# Patient Record
Sex: Female | Born: 1995 | Race: White | Hispanic: No | Marital: Single
Health system: Midwestern US, Community
[De-identification: ages and names within clinical notes are randomized; demographics above are authoritative.]

## PROBLEM LIST (undated history)

## (undated) MED FILL — ARIPIPRAZOLE 10MG TABS: 10 MG | 30 days supply | Qty: 30 | Fill #0 | Status: AC

---
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
## 2017-11-06 ENCOUNTER — Ambulatory Visit: Admit: 2017-11-06 | Payer: PRIVATE HEALTH INSURANCE | Attending: Psychiatric/Mental Health

## 2017-11-06 DIAGNOSIS — F339 Major depressive disorder, recurrent, unspecified: Secondary | ICD-10-CM

## 2017-11-06 LAB — CBC
Basophils Absolute: 0.1 10*3/uL (ref 0.0–0.3)
Basophils Relative: 0.7 % (ref 0.0–2.0)
Eosinophils Absolute: 0.3 10*3/uL (ref 0.0–0.6)
Eosinophils Relative: 3.3 % (ref 0.0–7.0)
Hematocrit: 42.4 % (ref 35.0–46.0)
Hemoglobin: 14.6 G/DL (ref 12.0–15.6)
Lymphocytes Absolute: 1.8 10*3/uL (ref 0.9–4.1)
Lymphocytes Relative: 23.3 % (ref 18.0–47.0)
MCH: 30.3 PG (ref 27.0–33.0)
MCHC: 34.6 G/DL (ref 32.0–36.0)
MCV: 87.7 FL (ref 80.0–100.0)
Monocytes Absolute: 0.5 10*3/uL (ref 0.2–1.1)
Monocytes Relative: 6.3 % (ref 0–14.0)
Neutrophils Absolute: 5 10*3/uL (ref 1.5–7.8)
Neutrophils Relative: 66.4 % (ref 40.0–75.0)
Platelets: 350 10*3/uL (ref 130–400)
RBC: 4.83 10*6/uL (ref 3.90–5.20)
RDW: 12.7 % (ref 9.0–15.0)
WBC: 7.6 10*3/uL (ref 3.8–10.8)

## 2017-11-06 LAB — COMPREHENSIVE METABOLIC PANEL
A/G Ratio: 1.9 (CALC) (ref 0.8–2.6)
ALT: 10 U/L (ref 0–60)
AST: 15 U/L (ref 0–46)
Albumin: 4.3 GM/DL (ref 3.5–5.2)
Alkaline Phosphatase: 58 U/L (ref 23–144)
BUN/Creatinine Ratio: 13 (CALC) (ref 7–25)
BUN: 9 MG/DL (ref 3–29)
Calcium: 9.4 MG/DL (ref 8.5–10.5)
Carbon Dioxide (CO2): 24 MEQ/L (ref 19–32)
Chloride: 104 MEQ/L (ref 96–110)
Creatinine: 0.7 MG/DL
GFR Estimated (CKD-EPI): 125 mL/min/{1.73_m2}
Globulin, Total: 2.3 GM/DL (CALC) (ref 1.9–3.6)
Glucose: 97 MG/DL
Potassium: 4.4 MEQ/L (ref 3.4–5.3)
Sodium: 141 MEQ/L (ref 135–148)
Total Bilirubin: 0.2 MG/DL (ref 0.0–1.2)
Total Protein: 6.6 GM/DL (ref 6.0–8.3)

## 2017-11-06 LAB — LIPID PANEL
Cholesterol, Total: 191 MG/DL
HDL: 41 MG/DL
LDL Cholesterol: 128 MG/DL (CALC)
Triglycerides: 112 MG/DL
VLDL Cholesterol Cal: 22 MG/DL (CALC) (ref 4–28)

## 2017-11-06 LAB — TSH: TSH: 1.52 MICRO IU/ML (ref 0.400–4.500)

## 2017-11-06 LAB — T4: T4, Total: 6.5 ug/dL (ref 4.0–12.9)

## 2017-11-06 LAB — T UPTAKE
Free Thyroxine Index: 2 (ref 1.2–3.6)
T3 Uptake Ratio: 30.3 % (ref 24.0–40.8)

## 2017-11-06 LAB — VITAMIN D 25 HYDROXY: Vit D, 25-Hydroxy: 22 NG/ML (ref 20–100)

## 2017-11-06 NOTE — Unmapped (Signed)
[address]   [facility]  Initial Psychiatric Assessment    Name: Tasha Buckley  Date: 11/06/2017  Duration: On 11/06/2017 I met with Tasha Buckley in my office for a 90 minute visit. She was seen face to face. No family was present for the visit. Tasha Buckley was seen for pharmacological management and monitoring of depression and anxiety.  Billing Code: 98119    Chief Complaint: My mood swings are getting so severe I had to drop out of school.    HPI: The client is a 22 year old Caucasian female with a history of anxiety and depression. Tasha Buckley (prefers the name Tasha Buckley) was calm, cooperative, and open to the therapeutic process during session. Tasha Buckley started the session by discussing that college has always been difficult for her but this semester she completely lost it and made the decision to come home. The previous semester she had to come home early due to her anxiety and during the summer between her freshman and sophomore year she was hospitalized for suicidal ideation. She struggled to identify a trigger for her recent meltdown; she was sitting on her couch and starting looking up jobs that she could get without a college degree and started hyperventilating and called her mother to tell her she wanted to drop out. Since the beginning of this semester she has struggled with the motivation to get up and go to class and she was unable to remember the last time where she had a week of consistent attendance. She described her moods as fluctuating; when she is with her roommates being social she feels great but when she is alone her thoughts race and she feels depressed. At times she's felt like she's on autopilot. She denies any changes in her appetite or sleep schedule. She discussed that she loves sleep and on days when she feels depressed she will sometimes take over-the-counter sleep aids during the day so she can relax and sleep through it.    When discussing  symptoms of depression Tasha Buckley endorsed sad moods, anhedonia, hypersomnia, difficulty getting out of bed in the morning, decreased energy and concentration, crying spells, passive thoughts of death, poor self-care, low self-esteem, and occasional worsening of her mood when she gets her period. She experiences meltdowns or panic attacks every few months that present with difficulty breathing, racing heart, extreme anxiety, and heightened sensitivity where she will snap at someone. She often feels overwhelmed, restless, and anxious to be around people and meeting new people. Tasha Buckley discussed that she has a family history of bipolar and endorsed several symptoms of mania including distractibility, some difficulty with sleep, increased goal directed activity (like starting a new diet or exercise plan that she never follows through on), flight of ideas, impulsive choices (buying a rat without any previous thought, dyeing her hair 3 times a month, going out late at night for food, seeking adventures, and once going into an abandoned building with a friend), irritability, and anger outbursts. She struggled to identify a timeline or if these were happening during distinct episodes of time. She feels that she has poor impulses and judgement most of the time. She denies that these symptoms may have occurred for 7 days at a time but thinks it's possible that they have occurred 4 days at a time.    Tasha Buckley endorsed some symptoms of ADHD including poor attention to detail, making careless mistakes on her homework or not turning things in, difficulty listening and sustaining attention unless she's very interested in the  topic, inability to organize, often losing things, and difficulty sitting still. She denies ever being diagnosed with anorexia or bulimia but that she does have a history of horrible body image and some disordered eating by skipping occasional meals. She denies symptoms of PTSD, OCD, psychosis, paranoia, or  delusional thinking. When discussing goals for treatment Tasha Buckley verbalized that she wants to get back on track by having her medications adjusted, start with a new therapist, get everything lined up for online school next year, and find a new job.     Past Psychiatric History:  Previous inpatient admission: Tasha Buckley was hospitalized once at [facility] from 08/29/16 to 08/31/16 for suicidal ideation and superficial cutting; the admission was triggered by being told by peers that she looked like she had lost weight but when she weighed herself she saw that she actually hadn't lost any weight. She denies any hospitalizations since then. She sought care this month from [facility] but her family ultimately decided not to pursue due to financial reasons.  Previous history of violence to self or others: Tasha Buckley reports a brief history of self-harm by cutting; she started cutting superficially 2 weeks prior to her inpatient admission in 2018. She sometimes cuts now with the most recent episode on 11/01/17. She discussed passive suicidal ideation to drive her car into a pole or overdose on medications but denies any intent. She denies any homicidal ideation or access to firearms.  Protective factors/strengths: Protective factors include her family, friends, and boyfriend. She discussed that after watching '13 Reasons Why' she thought of suicide as almost selfish, and the aftermath for the ones you care about is horrible. Strengths include her ability to listen to others and be compassionate.  Past/Current Outpatient Treatment: Tasha Buckley was seeing a Publishing rights manager at [facility] [place] [place] for medication management as well as a Paramedic. She has not seen the therapist at all this semester; while she wanted to reach out Tasha Buckley expressed anxiety because she ghosted her therapist last year and didn't feel like explaining it. She is currently seeking a therapist and her primary care provider was prescribing  medications until she could meet with a psychiatric provider.  Current medication regimen: Currently prescribed Celexa 40 mg (started in March of 2018) and Seroquel 50 mg that was started a few days ago.  Past Psychiatric Medication Trials: During her inpatient admission in August of 2018 she was prescribed Wellbutrin XL 150 mg as an augmentation to her Celexa but she stopped taking it a year ago due to no longer finding it helpful. She was prescribed Abilify in the past and found the side effects to be horrible including drooling, akathisia, and some difficulty breathing. She has noticed similar symptoms with the Seroquel (mainly akathisia) but has not found it to be as severe. She denies any additional medication trials.     Current Outpatient Prescriptions   Medication Sig   ??? QUEtiapine Take 50 mg by mouth at bedtime.   ??? citalopram Take 40 mg by mouth daily.     No current facility-administered medications for this visit.      Substance Use History:  ETOH: Denies regular alcohol use. Tasha Buckley discussed that this was a pretty big issue in high school and that she'd been abstaining from alcohol for the past few years. She drank twice in the last month but required 10+ drinks due to her high tolerance.  MJ: Denies history or current use of marijuana.  Caffeine: Endorses drinking 1 soda daily.  Tobacco: Denies history or current use of tobacco.  Illicits: Denies history or current use of any additional illicit substances.    Allergies to Food, Medications, Products:  No Known Allergies    Past Medical, Surgical, Family History:  Past Medical History:   Diagnosis Date   ??? Anxiety    ??? Depression    ??? Mood swings      Non-psychiatric medications: None.  Most recent labs: Available labs reviewed from August 2018 through chart review.   Chronic illnesses: Tasha Buckley reports a history of one concussion in 2016.  Surgeries: Denies any surgical procedures.     Family History   Problem Relation Age of Onset   ??? Alcohol  abuse Mother    ??? Bipolar disorder Mother    ??? Depression Mother    ??? Anxiety disorder Sister    ??? OCD Sister    ??? Learning disabilities Sister      Social History:  Lives with / Family: Tasha Buckley recently moved back in with her parents, who are still married, in [place], [place]. She has one sister (age 35) with a history of OCD and nonverbal learning disorder who lives in [place]. She has one dog, cat, and rat. While she is sad to be leaving her friends behind in [place] she otherwise has not found the transition to be difficult and feels she made the right decision. Her parents have been very supportive through the transition as well.  Development / Childhood: Tasha Buckley grew up in [place] until age 3 and her family moved to [place]. When her father lost his job during her freshman year of college the family moved back to [place] where he was able to find employment again. Tasha Buckley denies any developmental delays or significant childhood stressors.  Education: Tasha Buckley was attending the [employer]. This year she started her junior year studying human development and family studies. In the spring she intends to start online school through [employer] and to switch her focus to substance abuse counseling. She attended high school in [place].  Occupation: Over the summer Tasha Buckley worked at [employer] and while in college she worked at [employer]. She intends to find work as soon as possible.  Leisure: In her downtime Tasha Buckley enjoys being with her friends, talking to her boyfriend, watching TV and movies (mostly crime dramas and documentaries), and would like to get into horseback riding.  Relationships: Tasha Buckley has been in a relationship with her boyfriend since the summer of 2019; they met each other working at [employer]. His family lives here and he studies Lawyer in [place] state. Tasha Buckley considers her college roommates to be her best  friends.  Spiritual: Identifies as [employer] Knuckles; in the past Tasha Buckley has been highly involved with the [employer] she was attending in [place].  Legal: Reports receiving a ticket for expired tags in 2018.  Military: Denies Financial planner.  Sexuality: Heterosexual and identifies with birth gender.  Trauma: Tasha Buckley denies any history of sexual, physical, or emotional abuse. She denies any significant losses.    Physical Review Of Systems:  Constitutional: Denies fatigue, malaise, fever, chills.  HEENT: Denies ear/eye pain or drainage, changes in vision or hearing, congestion or throat pain.  Respiratory: Denies difficulty breathing or chest pain, aside from when she experiences a panic attack.  Cardiovascular: Denies palpitations or irregular heart beats.  Gastrointestinal: Denies abd pain, N/V, diarrhea or constipation.  Genitourinary: Denies frequency/urgency  or pain upon urination.  Integument/Breast: Denies abnormal lesions or changes in skin integrity.  Hematologic/Lymphatic: Denies clotting issues, increased bruising, swollen or tender lymph nodes.  Musculoskeletal: Denies muscle/joint pain or swelling.  Neurological: Denies headache, tremor, dizziness, change in consciousness.    Objective:  Weight: 203 lbs  Vital Signs:   Vitals:    11/06/17 1410   BP: 129/85   Pulse: 76   Resp: 17   Integument: No concerning moles or nevi. No areas of impaired skin integrity.  Musculoskeletal: Normal gait and station. Full ROM all extremities.    Mental Status Exam:  General: Appropriately dressed but disheveled, polite, cooperative, good eye contact.  Speech: Normal rate, rhythm, articulations. Normal quality.  Motor: No atrophy or abnormal movements. Normal gait and station.  Mood/ Affect: Mood described as ???okay???, affect congruent.  Perception: Denies auditory/visual/tactile/olfactory hallucinations.  Thought Content: Denies delusions, paranoia.   Thought Process: Linear, goal-directed.  Memory: Short and  long-term memory grossly intact as evidenced by historical recall of verifiable facts from patient???s history.  Cognitive: Alert and oriented to all spheres, attention/concentration/focus intact and at patient???s baseline.  Safety: Endorses passive suicidal thoughts. Denies intent, plan. Denies homicidal thoughts, intent plan. Denies thoughts or plan for self-injury.  Insight/ Judgement: Fair insight and limited judgement.    Diagnoses:  Axis I: Major depressive disorder, recurrent episode with mixed features (CMS Dx); rule out bipolar II  Axis II: Deferred   Axis III: None    Assessment/Plan:  1. Continue current medication regimen of Celexa 40 mg and Seroquel 50 mg. Due to Tasha Buckley's poor response to Abilify (mainly akathisia and drooling) and current complaints of restless legs at night with Seroquel no changes will be made until further collaboration can be done. Tasha Buckley gave permission for this clinician to discuss medications with her mother who has been on a ton of them for bipolar. Discussed the possibility of using Lamictal to target symptoms of irritability and move from the atypical antipsychotic class of medication. This clinician intends to reach out to her mother tomorrow and make further decisions regarding medication management. She was encouraged to contact this clinician if symptoms of Seroquel worsen and discussed that it will likely be discontinued following conversation (she does not feel like the side effects are nearly as troubling as they were with Abilify). Discussed risks, benefits, and side effects with pt including but not limited to the mechanism of action of Lamictal and that it is an anticonvulsant/mood stabilizer that is used to treat symptoms of bipolar. Discussed side effects of sedation, dizziness, nausea, possibility of decreased efficacy of oral contraceptives, the importance of compliance, alcohol use, and skin reactions. Discussed with pt Stevens-Johnson Syndrome which is a severe  adverse reaction of the skin and mucous membranes and requires immediate medical attention, symptoms often begins with fever and flu-like symptoms and then within a few days a skin rash appears, the skin may blister and peel usually starting on the face and the chest and then spreading. Discussed that Stevens-Johnson syndrome can be life threatening and if symptoms occur report to the emergency room immediately.   2. Return to clinic in 2 weeks for follow-up  3. Clinician to refer pt to internal therapist at [facility]  4. Available labs reviewed from August 2018 through chart review. Review additional labs to be completed including lipids and TSH  5. Pt/family to call or e-mail with questions or concerns  6. Pt signed consent to communicate with provider through e-mail and for this  clinician to share information with her mother  7. Completed Ham-D screening (9 - mild depression) and Ham-A (19 - moderate anxiety)  8. Reviewed OAARS, findings consistent with pt report and provider's expectations  9. Pt in agreement with this plan  Risk of Self Harm: Moderate  Risk of Harm to Others: Low  Assets/Strengths/Protective Factors: ability to engage, cognitive skills, creativity, prior successful treatment, sense of humor and suppportive network  Weaknesses/Limitations/Barriers to Treatment: lacks impulse control, lacks judgement and mental illness  Attitudes and Behaviors that require change: Affective instability/mood dysregulation  Self-harming impulses/behaviors  Maladaptive coping strategies    Janace Litten, APRN-CNP  Psychiatric Mental-Health Nurse Practitioner

## 2017-11-06 NOTE — Unmapped (Signed)
OP Treatment Plan    Matisha Termine  [id number]    [facility] TREATMENT PLAN:   General Goals:  Reduce risk of symptomatic/syndromic/functional relapse or recurrence. Patient will be able to recognize, accept, and manage his/her mental health and/or substance misuse disorders, including working with the medical staff to manage his/her medications. Improve symptoms/maintain symptom improvement. Reduce risk of hospital stays and Avoid/Prevent hospitalization. Reduce risk of suicide attempts/no active suicide attempts. Patient will report at least a 50% improvement in intensity and frequency of his/her symptoms. Patient will report improved ability to attend to usual functions and roles.  Med/Som Specific:  Patient will report all signs and symptoms to the MD/NP. Patient will attend all scheduled appointments with MD/NP. Patient will engage in an informed consent discussion regarding medications for the treatment of their symptoms. Patient will report any side effect issues related to the medications. Patient will demonstrate adherence to the medication treatment that is established. Patient will agree to have MD/NP collaborate with other health professional as needed to ensure optimal care.  Therapy Specific:  Patient will attend and be an active participant in individual therapy.  Expected Frequency of Visits:  Q 2 weeks/Ongoing  Prognosis:  Good

## 2017-11-07 MED ORDER — QUEtiapine (SEROQUEL) 50 MG tablet
50 | ORAL_TABLET | Freq: Every evening | ORAL | 0 refills | Status: AC
Start: 2017-11-07 — End: 2017-11-18

## 2017-11-07 NOTE — Unmapped (Signed)
This clinician spoke to Tasha Buckley's mother to obtain collateral information and discuss medications. Tasha Buckley's mother Tasha Buckley was diagnosed with bipolar II many years ago and has been on the same medication regimen for some time. 20 years ago she was prescribed Celexa 40 mg for depression and has been taking it ever since without issue. 15 years ago she was started on Seroquel and has found it very helpful. Her dose was titrated up to 800 mg and she started experiencing side effects like restless legs; once decreased back to 600 mg she did not have any issues. She did mention that she and Tasha Buckley have never done well with extended release versions of medications, and she was unsure why Tasha Buckley was prescribed an extended release version of Seroquel considering this history.    Tasha Buckley and her mother would prefer that she try an immediate release version of Seroquel before trying a different medication due to her mother's previous success; this clinician was in agreement and sent in the prescription with the intent to see Tasha Buckley for follow-up in 2 weeks.    Tasha Buckley was previously aware of side effects to monitor for due to her bad experience with Abilify. Discussed/reviewed mechanism of action of antipsychotics (dopamine and for some also serotonin); and the purpose of this medication is to help manage mood and behavioral problems, and some for assistance with sleep. Side effects including but are not limited to sedation, orthostatic hypotension, dizziness, constipation, dry mouth, agitation, sexual side effects, glucose dysregulation, dyslipidemias, weight gain, increase in breast size or nipple discharge, hyperprolactinemia, drooling, lower the seizure threshold, cardiac arrhythmias, sudden cardiac death, neuroleptic malignant syndrome (although rare to occur and even more rare with second generation antipsychotics can occur and symptoms include but are not limited to: fever, confusion, rigidity, tachycardia, dysphagia),  extrapyramidal symptoms (side effects including but not limited to: parkinsonism symptoms including tremor, balance problems, stiffness, slow movement, shuffle gait,  akathisia or restlessness, dystonia, and tardive dyskinesia.  We have talked about that tardive dyskinesia (TD) possibly irreversible movements of my mouth, jaw, tongue, hands, feet, or body, discussed more likely to occur with older antipsychotic medicine and that it can occur spontaneously even when someone has never taken these medicines.

## 2017-11-18 ENCOUNTER — Ambulatory Visit: Admit: 2017-11-18 | Payer: PRIVATE HEALTH INSURANCE | Attending: Psychiatric/Mental Health

## 2017-11-18 DIAGNOSIS — F339 Major depressive disorder, recurrent, unspecified: Secondary | ICD-10-CM

## 2017-11-18 MED ORDER — QUEtiapine (SEROQUEL) 100 MG tablet
100 | ORAL_TABLET | Freq: Every evening | ORAL | 1 refills | Status: AC
Start: 2017-11-18 — End: 2018-01-20

## 2017-11-18 NOTE — Unmapped (Signed)
[place]   [facility]  Outpatient Follow-Up Appointment    Name: Tasha Buckley  Date: 11/18/2017  Duration: On 11/18/2017 I met with Tasha Buckley in my office for a 30 minute visit. She was seen face to face. No family members were present for the visit.  Tasha Buckley was seen for pharmacological management and monitoring of depression, anxiety and irritability.  Psychotherapy add on: N/A. Tasha Buckley remains active in treatment and progress is being made towards the goal of managing her mood. Tasha Buckley remains safe for outpatient with minimal risks.  Billing Code: 16109    Chief Complaint: The medication has been better but stressful things have been going on.     HPI: Tasha Buckley presented as calm, cooperative, and relaxed during today's session. She began by discussing that her boyfriend abruptly broke up with her a little over a week ago because he is too stressed being so far away from her and school is hard, so it just isn't working out. While he said they were ultimately on a break rather than broken up contact has been very brief and Tasha Buckley has stopped trying to reach him. Tasha Buckley's mood wasn't the best for the first few days following the break-up, but overall she is doing well with only some residual anger. She was able to find a job and was hired yesterday at an [employer] in [facility]; she's excited about this because she'll have something to do and enjoy a 40% discount. She denies any changes in sleep or appetite and thinks her medication is working a lot better since switching to the immediate release formulation. Her primary stressor at this time is working on the withdrawal process from school; she requested that this clinician write a letter to provide to the school regarding the circumstances of her dropping out and the medical necessity. Tasha Buckley denies any recent self-harm or ideations; her parents left her alone for 5 days and she didn't have any  problems. She remains easily irritable and things like her parents checking in on her while they were away was way more annoying to her than it should have been. Reviewed lab-work and discussed starting therapy.    Current Outpatient Medications   Medication Sig   ??? citalopram Take 40 mg by mouth daily.   ??? QUEtiapine Take 1 tablet (50 mg total) by mouth at bedtime. Indications: MOOD     No current facility-administered medications for this visit.      Original Presentation to Outpatient Setting (11/06/17):  HPI: The client is a 22 year old Caucasian female with a history of anxiety and depression. Tasha Buckley (prefers the name Tasha Buckley) was calm, cooperative, and open to the therapeutic process during session. Tasha Buckley started the session by discussing that college has always been difficult for her but this semester she completely lost it and made the decision to come home. The previous semester she had to come home early due to her anxiety and during the summer between her freshman and sophomore year she was hospitalized for suicidal ideation. She struggled to identify a trigger for her recent meltdown; she was sitting on her couch and starting looking up jobs that she could get without a college degree and started hyperventilating and called her mother to tell her she wanted to drop out. Since the beginning of this semester she has struggled with the motivation to get up and go to class and she was unable to remember the last time where she had a week of consistent  attendance. She described her moods as fluctuating; when she is with her roommates being social she feels great but when she is alone her thoughts race and she feels depressed. At times she's felt like she's on autopilot. She denies any changes in her appetite or sleep schedule. She discussed that she loves sleep and on days when she feels depressed she will sometimes take over-the-counter sleep aids during the day so she can relax and sleep  through it.  ??  When discussing symptoms of depression Tasha Buckley endorsed sad moods, anhedonia, hypersomnia, difficulty getting out of bed in the morning, decreased energy and concentration, crying spells, passive thoughts of death, poor self-care, low self-esteem, and occasional worsening of her mood when she gets her period. She experiences meltdowns or panic attacks every few months that present with difficulty breathing, racing heart, extreme anxiety, and heightened sensitivity where she will snap at someone. She often feels overwhelmed, restless, and anxious to be around people and meeting new people. Tasha Buckley discussed that she has a family history of bipolar and endorsed several symptoms of mania including distractibility, some difficulty with sleep, increased goal directed activity (like starting a new diet or exercise plan that she never follows through on), flight of ideas, impulsive choices (buying a rat without any previous thought, dyeing her hair 3 times a month, going out late at night for food, seeking adventures, and once going into an abandoned building with a friend), irritability, and anger outbursts. She struggled to identify a timeline or if these were happening during distinct episodes of time. She feels that she has poor impulses and judgement most of the time. She denies that these symptoms may have occurred for 7 days at a time but thinks it's possible that they have occurred 4 days at a time.  ??  Tasha Buckley endorsed some symptoms of ADHD including poor attention to detail, making careless mistakes on her homework or not turning things in, difficulty listening and sustaining attention unless she's very interested in the topic, inability to organize, often losing things, and difficulty sitting still. She denies ever being diagnosed with anorexia or bulimia but that she does have a history of horrible body image and some disordered eating by skipping occasional meals. She denies symptoms of  PTSD, OCD, psychosis, paranoia, or delusional thinking. When discussing goals for treatment Tasha Buckley verbalized that she wants to get back on track by having her medications adjusted, start with a new therapist, get everything lined up for online school next year, and find a new job.    Past Psychiatric History:  Previous inpatient admission: Tasha Buckley was hospitalized once at [facility] from 08/29/16 to 08/31/16 for suicidal ideation and superficial cutting; the admission was triggered by being told by peers that she looked like she had lost weight but when she weighed herself she saw that she actually hadn't lost any weight. She denies any hospitalizations since then. She sought care this month from [facility] but her family ultimately decided not to pursue due to financial reasons.  Previous history of violence to self or others: Tasha Buckley reports a brief history of self-harm by cutting; she started cutting superficially 2 weeks prior to her inpatient admission in 2018. She sometimes cuts now with the most recent episode on 11/01/17. She discussed passive suicidal ideation to drive her car into a pole or overdose on medications but denies any intent. She denies any homicidal ideation or access to firearms.  Protective factors/strengths: Protective factors include her family, friends, and boyfriend. She  discussed that after watching '13 Reasons Why' she thought of suicide as almost selfish, and the aftermath for the ones you care about is horrible. Strengths include her ability to listen to others and be compassionate.  Past/Current Outpatient Treatment: Tasha Buckley was seeing a Publishing rights manager at [place] [facility] [place] [place] for medication management as well as a Paramedic. She has not seen the therapist at all this semester; while she wanted to reach out Tasha Buckley expressed anxiety because she ghosted her therapist last year and didn't feel like explaining it. She is currently seeking a therapist and her  primary care provider was prescribing medications until she could meet with a psychiatric provider.  Current medication regimen: Currently prescribed Celexa 40 mg (started in March of 2018) and Seroquel 50 mg that was started a few days ago.  Past Psychiatric Medication Trials: During her inpatient admission in August of 2018 she was prescribed Wellbutrin XL 150 mg as an augmentation to her Celexa but she stopped taking it a year ago due to no longer finding it helpful. She was prescribed Abilify in the past and found the side effects to be horrible including drooling, akathisia, and some difficulty breathing. She has noticed similar symptoms with the Seroquel (mainly akathisia) but has not found it to be as severe. She denies any additional medication trials.    Substance Use History:  ETOH: Denies regular alcohol use. Tasha Buckley discussed that this was a pretty big issue in high school and that she'd been abstaining from alcohol for the past few years. She drank twice in the last month but required 10+ drinks due to her high tolerance.  MJ: Denies history or current use of marijuana.  Caffeine: Endorses drinking 1 soda daily.  Tobacco: Denies history or current use of tobacco.  Illicits: Denies history or current use of any additional illicit substances.    Allergies to Food, Medications, Products:  No Known Allergies    Past Medical, Surgical, Family History:  Past Medical History:   Diagnosis Date   ??? Anxiety    ??? Depression    ??? Mood swings      Non-psychiatric medications: None.  Most recent labs: Available labs reviewed from August 2018 through chart review.   Chronic illnesses: Tasha Buckley reports a history of one concussion in 2016.  Surgeries: Denies any surgical procedures.     Family History   Problem Relation Age of Onset   ??? Alcohol abuse Mother    ??? Bipolar disorder Mother    ??? Depression Mother    ??? Anxiety disorder Sister    ??? OCD Sister    ??? Learning disabilities Sister      Social History:  Lives  with / Family: Tasha Buckley recently moved back in with her parents, who are still married, in [place], [place]. She has one sister (age 89) with a history of OCD and nonverbal learning disorder who lives in [place]. She has one dog, cat, and rat. While she is sad to be leaving her friends behind in [place] she otherwise has not found the transition to be difficult and feels she made the right decision. Her parents have been very supportive through the transition as well.  Development / Childhood: Tasha Buckley grew up in [place] until age 58 and her family moved to [place]. When her father lost his job during her freshman year of college the family moved back to [place] where he was able to find employment again. Tasha Buckley denies any developmental delays or  significant childhood stressors.  Education: Tasha Buckley was attending the [employer] [place], [place]. This year she started her junior year studying human development and family studies. In the spring she intends to start online school through [employer] and to switch her focus to substance abuse counseling. She attended high school in [place].  Occupation: Over the summer Tasha Buckley worked at [employer] and while in college she worked at [employer]. She intends to find work as soon as possible.  Leisure: In her downtime Tasha Buckley enjoys being with her friends, talking to her boyfriend, watching TV and movies (mostly crime dramas and documentaries), and would like to get into horseback riding.  Relationships: Tasha Buckley has been in a relationship with her boyfriend since the summer of 2019; they met each other working at [employer] [facility] [employer]. His family lives here and he studies Lawyer in [place] state. Tasha Buckley considers her college roommates to be her best friends.  Spiritual: Identifies as [employer] Knuckles; in the past Tasha Buckley has been highly involved with the [employer] she was attending in [place].  Legal: Reports  receiving a ticket for expired tags in 2018.  Military: Denies Financial planner.  Sexuality: Heterosexual and identifies with birth gender.  Trauma: Tasha Buckley denies any history of sexual, physical, or emotional abuse. She denies any significant losses.  ??  Physical Review Of Systems:  Constitutional: Denies fatigue, malaise, fever, chills.  HEENT: Denies ear/eye pain or drainage, changes in vision or hearing, congestion or throat pain.  Respiratory: Denies difficulty breathing or chest pain, aside from when she experiences a panic attack.  Cardiovascular: Denies palpitations or irregular heart beats.  Gastrointestinal: Denies abd pain, N/V, diarrhea or constipation.  Genitourinary: Denies frequency/urgency or pain upon urination.  Integument/Breast: Denies abnormal lesions or changes in skin integrity.  Hematologic/Lymphatic: Denies clotting issues, increased bruising, swollen or tender lymph nodes.  Musculoskeletal: Denies muscle/joint pain or swelling.  Neurological: Denies headache, tremor, dizziness, change in consciousness.  ??  Objective:  Weight: 203 lbs (taken from visit on 11/06/17).  Vital Signs: There were no vitals filed for this visit.  Integument: No concerning moles or nevi. No areas of impaired skin integrity.  Musculoskeletal: Normal gait and station. Full ROM all extremities.    Mental Status Exam:  General: Appropriately dressed but disheveled, polite, cooperative, good eye contact.  Speech: Normal rate, rhythm, articulations. Normal quality.  Motor: No atrophy or abnormal movements. Normal gait and station.  Mood/ Affect: Mood described as ???better???, affect congruent.  Perception: Denies auditory/visual/tactile/olfactory hallucinations.  Thought Content: Denies delusions, paranoia.   Thought Process: Linear, goal-directed.  Memory: Short and long-term memory grossly intact as evidenced by historical recall of verifiable facts from patient???s history.  Cognitive: Alert and oriented to all spheres,  attention/concentration/focus intact and at patient???s baseline.  Safety: Endorses passive suicidal thoughts. Denies intent, plan. Denies homicidal thoughts, intent plan. Denies thoughts or plan for self-injury.  Insight/ Judgement: Fair insight and limited judgement.  ??  Diagnoses:  Axis I: Major depressive disorder, recurrent episode with mixed features (CMS Dx); rule out bipolar II  Axis II: Deferred   Axis III: None  ??  Assessment/Plan:  1. Continue current medication regimen of Celexa 40 mg and increase Seroquel to 100 mg for one week and then 200 mg if well tolerated. Tasha Buckley was previously aware of side effects to monitor for due to her bad experience with Abilify. Discussed/reviewed mechanism of action of antipsychotics (dopamine and for some also serotonin); and  the purpose of this medication is to help manage mood and behavioral problems, and some for assistance with sleep. Side effects including but are not limited to sedation, orthostatic hypotension, dizziness, constipation, dry mouth, agitation, sexual side effects, glucose dysregulation, dyslipidemias, weight gain, increase in breast size or nipple discharge, hyperprolactinemia, drooling, lower the seizure threshold, cardiac arrhythmias, sudden cardiac death, neuroleptic malignant syndrome (although rare to occur and even more rare with second generation antipsychotics can occur and symptoms include but are not limited to: fever, confusion, rigidity, tachycardia, dysphagia), extrapyramidal symptoms (side effects including but not limited to: parkinsonism symptoms including tremor, balance problems, stiffness, slow movement, shuffle gait,  akathisia or restlessness, dystonia, and tardive dyskinesia.  We have talked about that tardive dyskinesia (TD) possibly irreversible movements of my mouth, jaw, tongue, hands, feet, or body, discussed more likely to occur with older antipsychotic medicine and that it can occur spontaneously even when someone has never  taken these medicines. Recommended Vitamin D3 supplement due to being on the low end of the normal recommended range  2. Return to clinic in 2-3 weeks for follow-up  3. Schedule appointment with Dr. Manual Meier at [facility]  4. Reviewed and discussed lab results; provided with a paper copy  5. Pt/family to call or e-mail with questions or concerns  6. Pt in agreement with this plan  Risk of Self Harm: Moderate  Risk of Harm to Others: Low  Assets/Strengths/Protective Factors: ability to engage, cognitive skills, creativity, prior successful treatment, sense of humor and suppportive network  Weaknesses/Limitations/Barriers to Treatment: lacks impulse control, lacks judgement and mental illness  Attitudes and Behaviors that require change: Affective instability/mood dysregulation  Self-harming impulses/behaviors  Maladaptive coping strategies  ??  Janace Litten, APRN-CNP  Psychiatric Mental-Health Nurse Practitioner   ??

## 2018-01-20 ENCOUNTER — Ambulatory Visit: Admit: 2018-01-20 | Payer: PRIVATE HEALTH INSURANCE | Attending: Psychiatric/Mental Health

## 2018-01-20 DIAGNOSIS — F339 Major depressive disorder, recurrent, unspecified: Secondary | ICD-10-CM

## 2018-01-20 MED ORDER — buPROPion XL (WELLBUTRIN XL) 150 MG 24 hr tablet
150 | ORAL_TABLET | Freq: Every day | ORAL | 1 refills | Status: AC
Start: 2018-01-20 — End: 2018-01-31

## 2018-01-20 MED ORDER — citalopram (CELEXA) 40 MG tablet
40 | ORAL_TABLET | Freq: Every day | ORAL | 1 refills | Status: AC
Start: 2018-01-20 — End: 2018-03-11

## 2018-01-20 MED ORDER — QUEtiapine (SEROQUEL) 100 MG tablet
100 | ORAL_TABLET | Freq: Every evening | ORAL | 1 refills | Status: AC
Start: 2018-01-20 — End: 2018-01-20

## 2018-01-20 MED ORDER — QUEtiapine (SEROQUEL) 100 MG tablet
100 | ORAL_TABLET | Freq: Every evening | ORAL | 1 refills | Status: AC
Start: 2018-01-20 — End: 2018-03-11

## 2018-01-20 NOTE — Unmapped (Addendum)
[place]   [facility]  Outpatient Follow-Up Appointment      Name: Tasha Buckley  Date: 01/20/2018  Duration: On 01/20/2018 I met with Tasha Buckley in my office for a 30 minute visit. She was seen face to face. No family members were present for the visit.  Tasha Buckley was seen for pharmacological management and monitoring of depression, anxiety and irritability.  Psychotherapy add on: 16 minutes. I provided behavioral, insight oriented, and supportive therapy during today's session. Theme focused on utilizing mindfulness/DBT activities when feeling urges to self-harm. Pt demonstrated understanding and progress is being made towards the goal of her mood. Tasha Buckley remains safe for outpatient with moderate risks.  Billing Code: [id number] + [id number]    Chief Complaint: Everything is a mess again.     HPI: Tasha Buckley was calm and relaxed during today's session but stated with an incongruent affect that everything is terrible again. Tasha Buckley endorses worsening depression, apathy, and difficulty getting out of bed in the mornings. A few weeks ago she made a trip to [place] with her younger sister and for reasons she does not know she became horribly anxious with panic attacks and suicidal ideation to crash the car into a pole. A possible stressor is that she was in [place] for school several months ago when everything spiraled out of control. She discussed that she was able to keep herself safe due to her sister. While things have improved some she is still having self-harm urges to cut herself with razors but she denied ideations, planning, or intent to end her life. She has been struggling to reach out to her parents when experiencing these urges because she doesn't want to bother them. Tasha Buckley has been distracting herself by watching TikTok videos or coming out of her room to watch TV with her mom.     There have been multiple changes going on recently for Tasha Buckley, she didn't  like her job at [employer] and quit after two weeks. 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 She will also be starting a new program at [facility] for drug and alcohol counseling in January. Her withdrawal from school was approved which she found to be a relief. Tasha Buckley denied any side effects of Seroquel and stated my mom really thinks it's helping. She still has some irritability, but it depends on the day on how annoying the parents are. No recent changes in sleep or appetite. She did not schedule an appointment yet for therapy but feels that this would be beneficial and she should.     Following this session this clinician reached out to Tasha Buckley's mother (which was discussed with Tasha Buckley beforehand) to discuss safety concerns and safety planning. Her mother, Tasha Buckley [phone]), was told about recent urges to self-harm with razors and difficulty coming forward to discuss these issues with family. Reviewed safety planning including but not limited to taking razors and sharp objects out of the bathroom/kitchen, parents keeping medication bottles and only providing Tasha Buckley with the doses needed for the day, utilizing new mindfulness activities, keeping active with family and friends rather than isolating, and utilizing crisis phone numbers as needed. Her mother was appreciative of this call (although frustrated that Tasha Buckley didn't discuss issues with her directly) and felt she could manage safety recommendations. Her mother also discussed that a lot of this is still a result of her break-up. Some evenings she will run out of her room crying at 0100 inconsolable because  of him and we don't know what to do other than validate her, tell her she's great, and that she'll find someone. Also discussed medication changes and the need to schedule therapy appointment.    Current Outpatient Medications   Medication Sig   ??? buPROPion XL Take 1 tablet (150 mg total) by mouth daily.   ??? citalopram  Take 1 tablet (40 mg total) by mouth daily.   ??? QUEtiapine Take 2 tablets (200 mg total) by mouth at bedtime.     No current facility-administered medications for this visit.      Original Presentation to Outpatient Setting (11/06/17):  HPI: The client is a 22 year old Caucasian female with a history of anxiety and depression. Tasha Buckley (prefers the name Tasha Buckley) was calm, cooperative, and open to the therapeutic process during session. Tasha Buckley started the session by discussing that college has always been difficult for her but this semester she completely lost it and made the decision to come home. The previous semester she had to come home early due to her anxiety and during the summer between her freshman and sophomore year she was hospitalized for suicidal ideation. She struggled to identify a trigger for her recent meltdown; she was sitting on her couch and starting looking up jobs that she could get without a college degree and started hyperventilating and called her mother to tell her she wanted to drop out. Since the beginning of this semester she has struggled with the motivation to get up and go to class and she was unable to remember the last time where she had a week of consistent attendance. She described her moods as fluctuating; when she is with her roommates being social she feels great but when she is alone her thoughts race and she feels depressed. At times she's felt like she's on autopilot. She denies any changes in her appetite or sleep schedule. She discussed that she loves sleep and on days when she feels depressed she will sometimes take over-the-counter sleep aids during the day so she can relax and sleep through it.  ??  When discussing symptoms of depression Tasha Buckley endorsed sad moods, anhedonia, hypersomnia, difficulty getting out of bed in the morning, decreased energy and concentration, crying spells, passive thoughts of death, poor self-care, low self-esteem, and occasional  worsening of her mood when she gets her period. She experiences meltdowns or panic attacks every few months that present with difficulty breathing, racing heart, extreme anxiety, and heightened sensitivity where she will snap at someone. She often feels overwhelmed, restless, and anxious to be around people and meeting new people. Tasha Buckley discussed that she has a family history of bipolar and endorsed several symptoms of mania including distractibility, some difficulty with sleep, increased goal directed activity (like starting a new diet or exercise plan that she never follows through on), flight of ideas, impulsive choices (buying a rat without any previous thought, dyeing her hair 3 times a month, going out late at night for food, seeking adventures, and once going into an abandoned building with a friend), irritability, and anger outbursts. She struggled to identify a timeline or if these were happening during distinct episodes of time. She feels that she has poor impulses and judgement most of the time. She denies that these symptoms may have occurred for 7 days at a time but thinks it's possible that they have occurred 4 days at a time.  ??  Tasha Buckley endorsed some symptoms of ADHD including poor attention to detail, making careless mistakes on  her homework or not turning things in, difficulty listening and sustaining attention unless she's very interested in the topic, inability to organize, often losing things, and difficulty sitting still. She denies ever being diagnosed with anorexia or bulimia but that she does have a history of horrible body image and some disordered eating by skipping occasional meals. She denies symptoms of PTSD, OCD, psychosis, paranoia, or delusional thinking. When discussing goals for treatment Tasha Buckley verbalized that she wants to get back on track by having her medications adjusted, start with a new therapist, get everything lined up for online school next year, and find a new  job.    Past Psychiatric History:  Previous inpatient admission: Tasha Buckley was hospitalized once at [facility] from 08/29/16 to 08/31/16 for suicidal ideation and superficial cutting; the admission was triggered by being told by peers that she looked like she had lost weight but when she weighed herself she saw that she actually hadn't lost any weight. She denies any hospitalizations since then. She sought care this month from [facility] but her family ultimately decided not to pursue due to financial reasons.  Previous history of violence to self or others: Tasha Buckley reports a brief history of self-harm by cutting; she started cutting superficially 2 weeks prior to her inpatient admission in 2018. She sometimes cuts now with the most recent episode on 11/01/17. She discussed passive suicidal ideation to drive her car into a pole or overdose on medications but denies any intent. She denies any homicidal ideation or access to firearms.  Protective factors/strengths: Protective factors include her family, friends, and boyfriend. She discussed that after watching '13 Reasons Why' she thought of suicide as almost selfish, and the aftermath for the ones you care about is horrible. Strengths include her ability to listen to others and be compassionate.  Past/Current Outpatient Treatment: Tasha Buckley was seeing a Publishing rights manager at [facility] [place] [place] for medication management as well as a Paramedic. She has not seen the therapist at all this semester; while she wanted to reach out Tasha Buckley expressed anxiety because she ghosted her therapist last year and didn't feel like explaining it. She is currently seeking a therapist and her primary care provider was prescribing medications until she could meet with a psychiatric provider.  Current medication regimen: Currently prescribed Celexa 40 mg (started in March of 2018) and Seroquel 50 mg that was started a few days ago.  Past Psychiatric Medication Trials:  During her inpatient admission in August of 2018 she was prescribed Wellbutrin XL 150 mg as an augmentation to her Celexa but she stopped taking it a year ago due to no longer finding it helpful. She was prescribed Abilify in the past and found the side effects to be horrible including drooling, akathisia, and some difficulty breathing. She has noticed similar symptoms with the Seroquel (mainly akathisia) but has not found it to be as severe. She denies any additional medication trials.    Substance Use History:  ETOH: Denies regular alcohol use. Tasha Buckley discussed that this was a pretty big issue in high school and that she'd been abstaining from alcohol for the past few years. She drank twice in the last month but required 10+ drinks due to her high tolerance.  MJ: Denies history or current use of marijuana.  Caffeine: Endorses drinking 1 soda daily.  Tobacco: Denies history or current use of tobacco.  Illicits: Denies history or current use of any additional illicit substances.    Allergies to Food, Medications, Products:  No Known Allergies    Past Medical, Surgical, Family History:  Past Medical History:   Diagnosis Date   ??? Anxiety    ??? Depression    ??? Mood swings      Non-psychiatric medications: None.  Most recent labs: Available labs reviewed from August 2018 through chart review.   Chronic illnesses: Tasha Buckley reports a history of one concussion in 2016.  Surgeries: Denies any surgical procedures.     Family History   Problem Relation Age of Onset   ??? Alcohol abuse Mother    ??? Bipolar disorder Mother    ??? Depression Mother    ??? Anxiety disorder Sister    ??? OCD Sister    ??? Learning disabilities Sister      Social History:  Lives with / Family: Tasha Buckley recently moved back in with her parents, who are still married, in [place], [place]. She has one sister (age 74) with a history of OCD and nonverbal learning disorder who lives in [place]. She has one dog, cat, and rat. While she is sad to be leaving  her friends behind in [place] she otherwise has not found the transition to be difficult and feels she made the right decision. Her parents have been very supportive through the transition as well.  Development / Childhood: Tasha Buckley grew up in [place] until age 65 and her family moved to [place]. When her father lost his job during her freshman year of college the family moved back to [place] where he was able to find employment again. Tasha Buckley denies any developmental delays or significant childhood stressors.  Education: Tasha Buckley was attending the [employer], [place]. This year she started her junior year studying human development and family studies. In the spring she intends to start online school through [employer] and to switch her focus to substance abuse counseling. She attended high school in [place].  Occupation: Over the summer Tasha Buckley worked at [employer] and while in college she worked at [employer]. She intends to find work as soon as possible.  Leisure: In her downtime Tasha Buckley enjoys being with her friends, talking to her boyfriend, watching TV and movies (mostly crime dramas and documentaries), and would like to get into horseback riding.  Relationships: Tasha Buckley has been in a relationship with her boyfriend since the summer of 2019; they met each other working at [employer]. His family lives here and he studies Lawyer in [place] state. Tasha Buckley considers her college roommates to be her best friends.  Spiritual: Identifies as [employer] Knuckles; in the past Tasha Buckley has been highly involved with the [employer] she was attending in [place].  Legal: Reports receiving a ticket for expired tags in 2018.  Military: Denies Financial planner.  Sexuality: Heterosexual and identifies with birth gender.  Trauma: Tasha Buckley denies any history of sexual, physical, or emotional abuse. She denies any significant losses.  ??  Physical Review Of  Systems:  Constitutional: Denies fatigue, malaise, fever, chills.  HEENT: Denies ear/eye pain or drainage, changes in vision or hearing, congestion or throat pain.  Respiratory: Denies difficulty breathing or chest pain, aside from when she experiences a panic attack.  Cardiovascular: Denies palpitations or irregular heart beats.  Gastrointestinal: Denies abd pain, N/V, diarrhea or constipation.  Genitourinary: Denies frequency/urgency or pain upon urination.  Integument/Breast: Denies abnormal lesions or changes in skin integrity.  Hematologic/Lymphatic: Denies clotting issues, increased bruising, swollen or tender lymph nodes.  Musculoskeletal: Denies  muscle/joint pain or swelling.  Neurological: Denies headache, tremor, dizziness, change in consciousness.  ??  Objective:  Weight: 203 lbs (taken from visit on 11/06/17).  Vital Signs: There were no vitals filed for this visit.  Integument: No concerning moles or nevi. No areas of impaired skin integrity.  Musculoskeletal: Normal gait and station. Full ROM all extremities.    Mental Status Exam:  General: Appropriately dressed but disheveled, polite, cooperative, adequate eye contact.  Speech: Normal rate, rhythm, articulations. Normal quality.  Motor: No atrophy or abnormal movements. Normal gait and station.  Mood/ Affect: Mood described as ???anxious???, affect flat at times.  Perception: Denies auditory/visual/tactile/olfactory hallucinations.  Thought Content: Denies delusions, paranoia.   Thought Process: Linear, goal-directed.  Memory: Short and long-term memory grossly intact as evidenced by historical recall of verifiable facts from patient???s history.  Cognitive: Alert and oriented to all spheres, attention/concentration/focus intact and at patient???s baseline.  Safety: Endorses passive suicidal thoughts. Denies intent, plan. Denies homicidal thoughts, intent plan. Denies plan for self-injury but endorses thoughts.  Insight/ Judgement: Fair insight and limited  judgement.  ??  Diagnoses:  Axis I: Major depressive disorder, recurrent episode with mixed features (CMS Dx); rule out bipolar II  Axis II: Deferred   Axis III: None  ??  Assessment/Plan:  1. Continue current medication regimen of Celexa 40 mg and Seroquel 200 mg. Tasha Buckley was previously aware of side effects to monitor for due to her bad experience with Abilify. Discussed/reviewed mechanism of action of antipsychotics (dopamine and for some also serotonin); and the purpose of this medication is to help manage mood and behavioral problems, and some for assistance with sleep. Side effects including but are not limited to sedation, orthostatic hypotension, dizziness, constipation, dry mouth, agitation, sexual side effects, glucose dysregulation, dyslipidemias, weight gain, increase in breast size or nipple discharge, hyperprolactinemia, drooling, lower the seizure threshold, cardiac arrhythmias, sudden cardiac death, neuroleptic malignant syndrome (although rare to occur and even more rare with second generation antipsychotics can occur and symptoms include but are not limited to: fever, confusion, rigidity, tachycardia, dysphagia), extrapyramidal symptoms (side effects including but not limited to: parkinsonism symptoms including tremor, balance problems, stiffness, slow movement, shuffle gait,  akathisia or restlessness, dystonia, and tardive dyskinesia.  We have talked about that tardive dyskinesia (TD) possibly irreversible movements of my mouth, jaw, tongue, hands, feet, or body, discussed more likely to occur with older antipsychotic medicine and that it can occur spontaneously even when someone has never taken these medicines. Recommended again Vitamin D3 supplement due to being on the low end of the normal recommended range. Start taking Wellbutrin XL 150 to augment Celexa (when taken before it was taken as monotherapy) to help target symptoms of apathy, low motivation, and difficulty getting out of bed.  Discussed/reviewed mechanism of action of bupropion (norepinephrine/dopamine), and the purpose of this medication is to help manage symptoms of depression. Clinician review side effects including but not limited to nervousness, restlessness, irritability, dry mouth, constipation, headache, reduced appetite and weight loss, nausea, dizziness, excessive sweating, and reduced ability to sleep if taken too late in the day, seizures (discussed can lower the seizure threshold and to avoid all alcohol when taking), vomiting, hallucinations, activating a hypomanic or manic episode for a person who has bipolar d/o (diagnosed or undiagnosed, including symptoms of increased in activity, rapid speech, feeling ???keyed up???, decreased need for sleep, being very excited or irritable, impulsiveness, or reckless behavior). Discussed FDA black box label warning - indicating that child/adolescent/young adult  up to age 74 (but can also occur in patients older than 25) can have  increased risk of suicidal thoughts or behavior (suicidality); the child/adolescent/young adult (patient) should be closely monitored for any worsening in depression, emergence of suicidal thinking or behavior, or unusual changes in behavior, such as sleeplessness, agitation, or withdrawal from normal social situations  2. Return to clinic in 2-3 weeks for follow-up  3. Schedule appointment with Dr. Manual [facility]ier at [facility]  4. Reviewed and discussed mindfulness techniques and provided with a paper copy  5. Provided safety recommendations (paper copy) to [facility] and discussed by phone with her mother including but not limited to parents managing medicines, removing sharp objects from the home, utilizing crisis phone numbers, DBT skills, and avoiding isolative behaviors  6. Pt/family to call or e-mail with questions or concerns  7. Pt in agreement with this plan  Risk of Self Harm: Moderate  Risk of Harm to Others: Low  Assets/Strengths/Protective Factors: ability to engage,  cognitive skills, creativity, prior successful treatment, sense of humor and suppportive network  Weaknesses/Limitations/Barriers to Treatment: lacks impulse control, lacks judgement and mental illness  Attitudes and Behaviors that require change: Affective instability/mood dysregulation  Self-harming impulses/behaviors  Maladaptive coping strategies  ??  Janace Litten, APRN-CNP  Psychiatric Mental-Health Nurse Practitioner   ??

## 2018-01-31 ENCOUNTER — Ambulatory Visit: Admit: 2018-01-31 | Discharge: 2018-02-06 | Payer: PRIVATE HEALTH INSURANCE | Attending: Psychiatric/Mental Health

## 2018-01-31 DIAGNOSIS — F339 Major depressive disorder, recurrent, unspecified: Secondary | ICD-10-CM

## 2018-01-31 MED ORDER — lamoTRIgine (LAMICTAL) 25 MG tablet
25 | ORAL_TABLET | Freq: Every day | ORAL | 0 refills | Status: AC
Start: 2018-01-31 — End: 2018-02-26

## 2018-01-31 NOTE — Unmapped (Signed)
[place]   [facility]  Outpatient Follow-Up Appointment      Name: Tasha Buckley  Date: 01/31/2018  Duration: On 01/31/2018 I met with [name] in my office for a 30 minute visit. She was seen face to face. No family members were present for the visit.  [name] was seen for pharmacological management and monitoring of depression, anxiety and irritability.  Psychotherapy add on: N/A. [name] continues to participate actively in treatment and progress is being made towards the goal of managing her depression, anxiety, and irritability. [name] remains safe for outpatient with moderate risks.  Billing Code: 16109    Chief Complaint: Medication follow-up.     HPI: [name] was initially scheduled for a routine follow-up appointment on 02/03/2018 but her mother called this clinician yesterday to request an earlier appointment; she denied safety concerns but feels that the Wellbutrin XL is agitating [name] in a way that it didn't when she was prescribed it in the past. Upon interview today [name] appeared calm and relaxed. Overall her Christmas was fine but she was highly irritated about multiple things. [name]'s grandmother visited which was annoying, because she's racist and judgmental. [name] discussed events that were irritating to her including seeing [place] with her family and her grandmother not getting it, her sister driving her car, and getting the wrong coffee drink at [employer] [facility] [employer]. All of these events sent her into spiraling rage where she felt like punching someone. She denied any changes in sleep, appetite, or impulsivity.    Medication planning was discussed at length and while [name] finds her Seroquel helpful she is anxious about increasing the dose due to recent weight gain since moving home from college. Discussed utilizing Lamictal to target symptoms of irritability and depression. Since her last visit [name]'s felt that self-harm thoughts and urges  have markedly decreased but she wonders if this is because she's been so focused on the irritability. She has not yet started her job but is looking forward to it; she intends to schedule her first therapy appointment after getting her new work schedule.    Current Outpatient Medications   Medication Sig   ??? citalopram Take 1 tablet (40 mg total) by mouth daily.   ??? lamoTRIgine Take 1 tablet (25 mg total) by mouth daily.   ??? QUEtiapine Take 2 tablets (200 mg total) by mouth at bedtime.     No current facility-administered medications for this visit.      Original Presentation to Outpatient Setting (11/06/17):  HPI: The client is a 23 year old Caucasian female with a history of anxiety and depression. [name] was calm, cooperative, and open to the therapeutic process during session. [name] started the session by discussing that college has always been difficult for her but this semester she completely lost it and made the decision to come home. The previous semester she had to come home early due to her anxiety and during the summer between her freshman and sophomore year she was hospitalized for suicidal ideation. She struggled to identify a trigger for her recent meltdown; she was sitting on her couch and starting looking up jobs that she could get without a college degree and started hyperventilating and called her mother to tell her she wanted to drop out. Since the beginning of this semester she has struggled with the motivation to get up and go to class and she was unable to remember the last time where she had a week of consistent  attendance. She described her moods as fluctuating; when she is with her roommates being social she feels great but when she is alone her thoughts race and she feels depressed. At times she's felt like she's on autopilot. She denies any changes in her appetite or sleep schedule. She discussed that she loves sleep and on days when she feels  depressed she will sometimes take over-the-counter sleep aids during the day so she can relax and sleep through it.  ??  When discussing symptoms of depression [name] endorsed sad moods, anhedonia, hypersomnia, difficulty getting out of bed in the morning, decreased energy and concentration, crying spells, passive thoughts of death, poor self-care, low self-esteem, and occasional worsening of her mood when she gets her period. She experiences meltdowns or panic attacks every few months that present with difficulty breathing, racing heart, extreme anxiety, and heightened sensitivity where she will snap at someone. She often feels overwhelmed, restless, and anxious to be around people and meeting new people. [name] discussed that she has a family history of bipolar and endorsed several symptoms of mania including distractibility, some difficulty with sleep, increased goal directed activity (like starting a new diet or exercise plan that she never follows through on), flight of ideas, impulsive choices (buying a rat without any previous thought, dyeing her hair 3 times a month, going out late at night for food, seeking adventures, and once going into an abandoned building with a friend), irritability, and anger outbursts. She struggled to identify a timeline or if these were happening during distinct episodes of time. She feels that she has poor impulses and judgement most of the time. She denies that these symptoms may have occurred for 7 days at a time but thinks it's possible that they have occurred 4 days at a time.  ??  [name] endorsed some symptoms of ADHD including poor attention to detail, making careless mistakes on her homework or not turning things in, difficulty listening and sustaining attention unless she's very interested in the topic, inability to organize, often losing things, and difficulty sitting still. She denies ever being diagnosed with anorexia or bulimia but that she does have a history of  horrible body image and some disordered eating by skipping occasional meals. She denies symptoms of PTSD, OCD, psychosis, paranoia, or delusional thinking. When discussing goals for treatment [name] verbalized that she wants to get back on track by having her medications adjusted, start with a new therapist, get everything lined up for online school next year, and find a new job.    Past Psychiatric History:  Previous inpatient admission: [name] was hospitalized once at [facility] from 08/29/16 to 08/31/16 for suicidal ideation and superficial cutting; the admission was triggered by being told by peers that she looked like she had lost weight but when she weighed herself she saw that she actually hadn't lost any weight. She denies any hospitalizations since then. She sought care this month from [facility] but her family ultimately decided not to pursue due to financial reasons.  Previous history of violence to self or others: [name] reports a brief history of self-harm by cutting; she started cutting superficially 2 weeks prior to her inpatient admission in 2018. She sometimes cuts now with the most recent episode on 11/01/17. She discussed passive suicidal ideation to drive her car into a pole or overdose on medications but denies any intent. She denies any homicidal ideation or access to firearms.  Protective factors/strengths: Protective factors include her family, friends, and boyfriend. She  discussed that after watching '13 Reasons Why' she thought of suicide as almost selfish, and the aftermath for the ones you care about is horrible. Strengths include her ability to listen to others and be compassionate.  Past/Current Outpatient Treatment: [name] was seeing a Publishing rights manager at [facility] [place] for medication management as well as a Paramedic. She has not seen the therapist at all this semester; while she wanted to reach out [name] expressed anxiety because she ghosted her  therapist last year and didn't feel like explaining it. She is currently seeking a therapist and her primary care provider was prescribing medications until she could meet with a psychiatric provider.  Current medication regimen: Currently prescribed Celexa 40 mg (started in March of 2018) and Seroquel 50 mg that was started a few days ago.  Past Psychiatric Medication Trials: During her inpatient admission in August of 2018 she was prescribed Wellbutrin XL 150 mg as an augmentation to her Celexa but she stopped taking it a year ago due to no longer finding it helpful. In the past year she was prescribed Trileptal which didn't make a difference. She was prescribed Abilify in the past and found the side effects to be horrible including drooling, akathisia, and some difficulty breathing. She has noticed similar symptoms with the Seroquel (mainly akathisia) but has not found it to be as severe. She denies any additional medication trials.    Substance Use History:  ETOH: Denies regular alcohol use. [name] discussed that this was a pretty big issue in high school and that she'd been abstaining from alcohol for the past few years. She drank twice in the last month but required 10+ drinks due to her high tolerance.  MJ: Denies history or current use of marijuana.  Caffeine: Endorses drinking 1 soda daily.  Tobacco: Denies history or current use of tobacco.  Illicits: Denies history or current use of any additional illicit substances.    Allergies to Food, Medications, Products:  No Known Allergies    Past Medical, Surgical, Family History:  Past Medical History:   Diagnosis Date   ??? Anxiety    ??? Depression    ??? Mood swings      Non-psychiatric medications: None.  Most recent labs: Available labs reviewed from August 2018 through chart review.   Chronic illnesses: [name] reports a history of one concussion in 2016.  Surgeries: Denies any surgical procedures.     Family History   Problem Relation Age of Onset   ???  Alcohol abuse Mother    ??? Bipolar disorder Mother    ??? Depression Mother    ??? Anxiety disorder Sister    ??? OCD Sister    ??? Learning disabilities Sister      Social History:  Lives with / Family: [name] recently moved back in with her parents, who are still married, in [place], [place]. She has one sister (age 40) with a history of OCD and nonverbal learning disorder who lives in [place]. She has one dog, cat, and rat. While she is sad to be leaving her friends behind in [place] she otherwise has not found the transition to be difficult and feels she made the right decision. Her parents have been very supportive through the transition as well.  Development / Childhood: [name] grew up in [place] until age 20 and her family moved to [place]. When her father lost his job during her freshman year of college the family moved back to [place] where  he was able to find employment again. [name] denies any developmental delays or significant childhood stressors.  Education: [name] was attending the [employer] [place]. This year she started her junior year studying human development and family studies. In the spring she intends to start online school through [employer] and to switch her focus to substance abuse counseling. She attended high school in [place].  Occupation: Over the summer [name] worked at [employer] and while in college she worked at [employer]. She intends to find work as soon as possible.  Leisure: In her downtime [name] enjoys being with her friends, talking to her boyfriend, watching TV and movies (mostly crime dramas and documentaries), and would like to get into horseback riding.  Relationships: [name] has been in a relationship with her boyfriend since the summer of 2019; they met each other working at [employer]. His family lives here and he studies Lawyer in [place] state. [name] considers her college roommates to be her  best friends.  Spiritual: Identifies as Ephri[employer] Knuckles; in the past [name] has been highly involved with the [employer] she was attending in [place].  Legal: Reports receiving a ticket for expired tags in 2018.  Military: Denies Financial planner.  Sexuality: Heterosexual and identifies with birth gender.  Trauma: [name] denies any history of sexual, physical, or emotional abuse. She denies any significant losses.  ??  Physical Review Of Systems:  Constitutional: Denies fatigue, malaise, fever, chills.  HEENT: Denies ear/eye pain or drainage, changes in vision or hearing, congestion or throat pain.  Respiratory: Denies difficulty breathing or chest pain, aside from when she experiences a panic attack.  Cardiovascular: Denies palpitations or irregular heart beats.  Gastrointestinal: Denies abd pain, N/V, diarrhea or constipation.  Genitourinary: Denies frequency/urgency or pain upon urination.  Integument/Breast: Denies abnormal lesions or changes in skin integrity.  Hematologic/Lymphatic: Denies clotting issues, increased bruising, swollen or tender lymph nodes.  Musculoskeletal: Denies muscle/joint pain or swelling.  Neurological: Denies headache, tremor, dizziness, change in consciousness.  ??  Objective:  Weight: 203 lbs (taken from visit on 11/06/17).  Vital Signs: There were no vitals filed for this visit.  Integument: No concerning moles or nevi. No areas of impaired skin integrity.  Musculoskeletal: Normal gait and station. Full ROM all extremities.    Mental Status Exam:  General: Appropriately dressed but disheveled, polite, cooperative, adequate eye contact.  Speech: Normal rate, rhythm, articulations. Normal quality.  Motor: No atrophy or abnormal movements. Normal gait and station.  Mood/ Affect: Mood described as ???irritable???, affect flat at times.  Perception: Denies auditory/visual/tactile/olfactory hallucinations.  Thought Content: Denies delusions, paranoia.   Thought Process: Linear,  goal-directed.  Memory: Short and long-term memory grossly intact as evidenced by historical recall of verifiable facts from patient???s history.  Cognitive: Alert and oriented to all spheres, attention/concentration/focus intact and at patient???s baseline.  Safety: Endorses passive suicidal thoughts. Denies intent, plan. Denies homicidal thoughts, intent plan. Denies plan for self-injury but endorses thoughts.  Insight/ Judgement: Fair insight and limited judgement.  ??  Diagnoses:  Axis I: Major depressive disorder, recurrent episode with mixed features (CMS Dx); rule out bipolar II  Axis II: Deferred   Axis III: None  ??  Assessment/Plan:  1. Continue current medication regimen of Celexa 40 mg and Seroquel 200 mg. [name] was previously aware of side effects to monitor for due to her bad experience with Abilify. Discussed/reviewed mechanism of action of antipsychotics (dopamine and for  some also serotonin); and the purpose of this medication is to help manage mood and behavioral problems, and some for assistance with sleep. Side effects including but are not limited to sedation, orthostatic hypotension, dizziness, constipation, dry mouth, agitation, sexual side effects, glucose dysregulation, dyslipidemias, weight gain, increase in breast size or nipple discharge, hyperprolactinemia, drooling, lower the seizure threshold, cardiac arrhythmias, sudden cardiac death, neuroleptic malignant syndrome (although rare to occur and even more rare with second generation antipsychotics can occur and symptoms include but are not limited to: fever, confusion, rigidity, tachycardia, dysphagia), extrapyramidal symptoms (side effects including but not limited to: parkinsonism symptoms including tremor, balance problems, stiffness, slow movement, shuffle gait,  akathisia or restlessness, dystonia, and tardive dyskinesia.  We have talked about that tardive dyskinesia (TD) possibly irreversible movements of my mouth, jaw, tongue, hands,  feet, or body, discussed more likely to occur with older antipsychotic medicine and that it can occur spontaneously even when someone has never taken these medicines. Discontinue Wellbutrin XL 150 mg due to increased irritability and agitation. Discussed medication planning at length and will start Lamictal 25 mg for symptoms of irritability and mixed features. Discussed risks, benefits, and side effects with pt including but not limited to the mechanism of action of Lamictal and that it is an anticonvulsant/mood stabilizer that is used to treat symptoms of bipolar. Discussed side effects of sedation, dizziness, nausea, possibility of decreased efficacy of oral contraceptives, the importance of compliance, alcohol use, and skin reactions. Discussed with pt Stevens-Johnson Syndrome which is a severe adverse reaction of the skin and mucous membranes and requires immediate medical attention, symptoms often begins with fever and flu-like symptoms and then within a few days a skin rash appears, the skin may blister and peel usually starting on the face and the chest and then spreading. Discussed that Stevens-Johnson syndrome can be life threatening and if symptoms occur report to the emergency room immediately. [name] was also given a handout regarding the side effects of Lamictal and how to distinguish a regular rash from a potential Lamictal rash/reaction and additional information was provided to give her mother regarding Lamictal, side effects, and rationale for choice  2. Return to clinic in 2-3 weeks for follow-up  3. Schedule appointment with Dr. Manual Meier at [facility]  4. Encouraged to continue utilizing mindfulness techniques  5. Reviewed safety recommendations again including but not limited to parents managing medicines, removing sharp objects from the home, utilizing crisis phone numbers, DBT skills, and avoiding isolative behaviors  6. Pt/family to call or e-mail with questions or concerns  7. Pt in agreement with  this plan  Risk of Self Harm: Moderate  Risk of Harm to Others: Low  Assets/Strengths/Protective Factors: ability to engage, cognitive skills, creativity, prior successful treatment, sense of humor and suppportive network  Weaknesses/Limitations/Barriers to Treatment: lacks impulse control, lacks judgement and mental illness  Attitudes and Behaviors that require change: Affective instability/mood dysregulation  Self-harming impulses/behaviors  Maladaptive coping strategies  ??  Janace Litten, APRN-CNP  Psychiatric Mental-Health Nurse Practitioner   ??

## 2018-02-03 ENCOUNTER — Ambulatory Visit: Payer: PRIVATE HEALTH INSURANCE | Attending: Psychiatric/Mental Health

## 2018-02-03 NOTE — Unmapped (Incomplete)
[place]   [facility]  Outpatient Follow-Up Appointment      Name: Tasha Buckley  Date: 01/28/2018  Duration: On 02/03/2018 I met with Tasha Buckley in my office for a 30 minute visit. She was seen face to face. No family members were present for the visit.  Tasha Buckley was seen for pharmacological management and monitoring of depression, anxiety and irritability.  Psychotherapy add on: 16 minutes. I provided behavioral, insight oriented, and supportive therapy during today's session. Theme focused on utilizing mindfulness/DBT activities when feeling urges to self-harm. Pt demonstrated understanding and progress is being made towards the goal of her mood. Tasha Buckley remains safe for outpatient with moderate risks.  Billing Code: [id number] + [id number]    Chief Complaint: ***.     HPI: ***    Current Outpatient Medications   Medication Sig   ??? buPROPion XL Take 1 tablet (150 mg total) by mouth daily.   ??? citalopram Take 1 tablet (40 mg total) by mouth daily.   ??? QUEtiapine Take 2 tablets (200 mg total) by mouth at bedtime.     No current facility-administered medications for this visit.      Original Presentation to Outpatient Setting (11/06/17):  HPI: The client is a 23 year old Caucasian female with a history of anxiety and depression. Tasha Buckley (prefers the name [name]) was calm, cooperative, and open to the therapeutic process during session. Tasha Buckley started the session by discussing that college has always been difficult for her but this semester she completely lost it and made the decision to come home. The previous semester she had to come home early due to her anxiety and during the summer between her freshman and sophomore year she was hospitalized for suicidal ideation. She struggled to identify a trigger for her recent meltdown; she was sitting on her couch and starting looking up jobs that she could get without a college degree and started hyperventilating and called her mother  to tell her she wanted to drop out. Since the beginning of this semester she has struggled with the motivation to get up and go to class and she was unable to remember the last time where she had a week of consistent attendance. She described her moods as fluctuating; when she is with her roommates being social she feels great but when she is alone her thoughts race and she feels depressed. At times she's felt like she's on autopilot. She denies any changes in her appetite or sleep schedule. She discussed that she loves sleep and on days when she feels depressed she will sometimes take over-the-counter sleep aids during the day so she can relax and sleep through it.  ??  When discussing symptoms of depression Tasha Buckley endorsed sad moods, anhedonia, hypersomnia, difficulty getting out of bed in the morning, decreased energy and concentration, crying spells, passive thoughts of death, poor self-care, low self-esteem, and occasional worsening of her mood when she gets her period. She experiences meltdowns or panic attacks every few months that present with difficulty breathing, racing heart, extreme anxiety, and heightened sensitivity where she will snap at someone. She often feels overwhelmed, restless, and anxious to be around people and meeting new people. Tasha Buckley discussed that she has a family history of bipolar and endorsed several symptoms of mania including distractibility, some difficulty with sleep, increased goal directed activity (like starting a new diet or exercise plan that she never follows through on), flight of ideas, impulsive choices (buying a rat without any previous thought, dyeing  her hair 3 times a month, going out late at night for food, seeking adventures, and once going into an abandoned building with a friend), irritability, and anger outbursts. She struggled to identify a timeline or if these were happening during distinct episodes of time. She feels that she has poor impulses and  judgement most of the time. She denies that these symptoms may have occurred for 7 days at a time but thinks it's possible that they have occurred 4 days at a time.  ??  Tasha Buckley endorsed some symptoms of ADHD including poor attention to detail, making careless mistakes on her homework or not turning things in, difficulty listening and sustaining attention unless she's very interested in the topic, inability to organize, often losing things, and difficulty sitting still. She denies ever being diagnosed with anorexia or bulimia but that she does have a history of horrible body image and some disordered eating by skipping occasional meals. She denies symptoms of PTSD, OCD, psychosis, paranoia, or delusional thinking. When discussing goals for treatment Tasha Buckley verbalized that she wants to get back on track by having her medications adjusted, start with a new therapist, get everything lined up for online school next year, and find a new job.    Past Psychiatric History:  Previous inpatient admission: [name] was hospitalized once at [facility] from 08/29/16 to 08/31/16 for suicidal ideation and superficial cutting; the admission was triggered by being told by peers that she looked like she had lost weight but when she weighed herself she saw that she actually hadn't lost any weight. She denies any hospitalizations since then. She sought care this month from [facility] but her family ultimately decided not to pursue due to financial reasons.  Previous history of violence to self or others: [name] reports a brief history of self-harm by cutting; she started cutting superficially 2 weeks prior to her inpatient admission in 2018. She sometimes cuts now with the most recent episode on 11/01/17. She discussed passive suicidal ideation to drive her car into a pole or overdose on medications but denies any intent. She denies any homicidal ideation or access to firearms.  Protective factors/strengths: Protective factors include  her family, friends, and boyfriend. She discussed that after watching '13 Reasons Why' she thought of suicide as almost selfish, and the aftermath for the ones you care about is horrible. Strengths include her ability to listen to others and be compassionate.  Past/Current Outpatient Treatment: [name] was seeing a Publishing rights manager at [facility] [place] [place] for medication management as well as a Paramedic. She has not seen the therapist at all this semester; while she wanted to reach out Tasha Buckley expressed anxiety because she ghosted her therapist last year and didn't feel like explaining it. She is currently seeking a therapist and her primary care provider was prescribing medications until she could meet with a psychiatric provider.  Current medication regimen: Currently prescribed Celexa 40 mg (started in March of 2018) and Seroquel 50 mg that was started a few days ago.  Past Psychiatric Medication Trials: During her inpatient admission in August of 2018 she was prescribed Wellbutrin XL 150 mg as an augmentation to her Celexa but she stopped taking it a year ago due to no longer finding it helpful. She was prescribed Abilify in the past and found the side effects to be horrible including drooling, akathisia, and some difficulty breathing. She has noticed similar symptoms with the Seroquel (mainly akathisia) but has not found it to be as severe.  She denies any additional medication trials.    Substance Use History:  ETOH: Denies regular alcohol use. Tasha Buckley discussed that this was a pretty big issue in high school and that she'd been abstaining from alcohol for the past few years. She drank twice in the last month but required 10+ drinks due to her high tolerance.  MJ: Denies history or current use of marijuana.  Caffeine: Endorses drinking 1 soda daily.  Tobacco: Denies history or current use of tobacco.  Illicits: Denies history or current use of any additional illicit  substances.    Allergies to Food, Medications, Products:  No Known Allergies    Past Medical, Surgical, Family History:  Past Medical History:   Diagnosis Date   ??? Anxiety    ??? Depression    ??? Mood swings      Non-psychiatric medications: None.  Most recent labs: Available labs reviewed from August 2018 through chart review.   Chronic illnesses: [name] reports a history of one concussion in 2016.  Surgeries: Denies any surgical procedures.     Family History   Problem Relation Age of Onset   ??? Alcohol abuse Mother    ??? Bipolar disorder Mother    ??? Depression Mother    ??? Anxiety disorder Sister    ??? OCD Sister    ??? Learning disabilities Sister      Social History:  Lives with / Family: Tasha Buckley recently moved back in with her parents, who are still married, in [place], [place]. She has one sister (age 46) with a history of OCD and nonverbal learning disorder who lives in [place]. She has one dog, cat, and rat. While she is sad to be leaving her friends behind in [place] she otherwise has not found the transition to be difficult and feels she made the right decision. Her parents have been very supportive through the transition as well.  Development / Childhood: Tasha Buckley grew up in [place] until age 37 and her family moved to [place]. When her father lost his job during her freshman year of college the family moved back to [place] where he was able to find employment again. Tasha Buckley denies any developmental delays or significant childhood stressors.  Education: [name] was attending the [employer] [place] [place]. This year she started her junior year studying human development and family studies. In the spring she intends to start online school through [employer] and to switch her focus to substance abuse counseling. She attended high school in [place].  Occupation: Over the summer [name] worked at [employer] and while in college she worked at [employer]. She intends to  find work as soon as possible.  Leisure: In her downtime [name] enjoys being with her friends, talking to her boyfriend, watching TV and movies (mostly crime dramas and documentaries), and would like to get into horseback riding.  Relationships: [name] has been in a relationship with her boyfriend since the summer of 2019; they met each other working at [employer]. His family lives here and he studies Lawyer in [place] state. [name] considers her college roommates to be her best friends.  Spiritual: Identifies as Ephri[employer] Knuckles; in the past [name] has been highly involved with the [employer] she was attending in [place].  Legal: Reports receiving a ticket for expired tags in 2018.  Military: Denies Financial planner.  Sexuality: Heterosexual and identifies with birth gender.  Trauma: [name] denies any history of sexual, physical,  or emotional abuse. She denies any significant losses.  ??  Physical Review Of Systems:  Constitutional: Denies fatigue, malaise, fever, chills.  HEENT: Denies ear/eye pain or drainage, changes in vision or hearing, congestion or throat pain.  Respiratory: Denies difficulty breathing or chest pain, aside from when she experiences a panic attack.  Cardiovascular: Denies palpitations or irregular heart beats.  Gastrointestinal: Denies abd pain, N/V, diarrhea or constipation.  Genitourinary: Denies frequency/urgency or pain upon urination.  Integument/Breast: Denies abnormal lesions or changes in skin integrity.  Hematologic/Lymphatic: Denies clotting issues, increased bruising, swollen or tender lymph nodes.  Musculoskeletal: Denies muscle/joint pain or swelling.  Neurological: Denies headache, tremor, dizziness, change in consciousness.  ??  Objective:  Weight: 203 lbs (taken from visit on 11/06/17).  Vital Signs: There were no vitals filed for this visit.  Integument: No concerning moles or nevi. No areas of impaired skin integrity.  Musculoskeletal: Normal gait  and station. Full ROM all extremities.    Mental Status Exam:  General: Appropriately dressed but disheveled, polite, cooperative, adequate eye contact.  Speech: Normal rate, rhythm, articulations. Normal quality.  Motor: No atrophy or abnormal movements. Normal gait and station.  Mood/ Affect: Mood described as ???anxious???, affect flat at times.  Perception: Denies auditory/visual/tactile/olfactory hallucinations.  Thought Content: Denies delusions, paranoia.   Thought Process: Linear, goal-directed.  Memory: Short and long-term memory grossly intact as evidenced by historical recall of verifiable facts from patient???s history.  Cognitive: Alert and oriented to all spheres, attention/concentration/focus intact and at patient???s baseline.  Safety: Endorses passive suicidal thoughts. Denies intent, plan. Denies homicidal thoughts, intent plan. Denies plan for self-injury but endorses thoughts.  Insight/ Judgement: Fair insight and limited judgement.  ??  Diagnoses:  Axis I: Major depressive disorder, recurrent episode with mixed features (CMS Dx); rule out bipolar II  Axis II: Deferred   Axis III: None  ??  Assessment/Plan:  1. Continue current medication regimen of Celexa 40 mg and Seroquel 200 mg. [name] was previously aware of side effects to monitor for due to her bad experience with Abilify. Discussed/reviewed mechanism of action of antipsychotics (dopamine and for some also serotonin); and the purpose of this medication is to help manage mood and behavioral problems, and some for assistance with sleep. Side effects including but are not limited to sedation, orthostatic hypotension, dizziness, constipation, dry mouth, agitation, sexual side effects, glucose dysregulation, dyslipidemias, weight gain, increase in breast size or nipple discharge, hyperprolactinemia, drooling, lower the seizure threshold, cardiac arrhythmias, sudden cardiac death, neuroleptic malignant syndrome (although rare to occur and even more rare  with second generation antipsychotics can occur and symptoms include but are not limited to: fever, confusion, rigidity, tachycardia, dysphagia), extrapyramidal symptoms (side effects including but not limited to: parkinsonism symptoms including tremor, balance problems, stiffness, slow movement, shuffle gait,  akathisia or restlessness, dystonia, and tardive dyskinesia.  We have talked about that tardive dyskinesia (TD) possibly irreversible movements of my mouth, jaw, tongue, hands, feet, or body, discussed more likely to occur with older antipsychotic medicine and that it can occur spontaneously even when someone has never taken these medicines. Recommended again Vitamin D3 supplement due to being on the low end of the normal recommended range. Start taking Wellbutrin XL 150 to augment Celexa (when taken before it was taken as monotherapy) to help target symptoms of apathy, low motivation, and difficulty getting out of bed. Discussed/reviewed mechanism of action of bupropion (norepinephrine/dopamine), and the purpose of this medication is to help manage symptoms of depression.  Clinician review side effects including but not limited to nervousness, restlessness, irritability, dry mouth, constipation, headache, reduced appetite and weight loss, nausea, dizziness, excessive sweating, and reduced ability to sleep if taken too late in the day, seizures (discussed can lower the seizure threshold and to avoid all alcohol when taking), vomiting, hallucinations, activating a hypomanic or manic episode for a person who has bipolar d/o (diagnosed or undiagnosed, including symptoms of increased in activity, rapid speech, feeling ???keyed up???, decreased need for sleep, being very excited or irritable, impulsiveness, or reckless behavior). Discussed FDA black box label warning - indicating that child/adolescent/young adult up to age 3 (but can also occur in patients older than 25) can have  increased risk of suicidal thoughts  or behavior (suicidality); the child/adolescent/young adult (patient) should be closely monitored for any worsening in depression, emergence of suicidal thinking or behavior, or unusual changes in behavior, such as sleeplessness, agitation, or withdrawal from normal social situations  2. Return to clinic in 2-3 weeks for follow-up  3. Schedule appointment with Dr. Manual Meier at [facility]  4. Reviewed and discussed mindfulness techniques and provided with a paper copy  5. Provided safety recommendations (paper copy) to [facility] and discussed by phone with her mother including but not limited to parents managing medicines, removing sharp objects from the home, utilizing crisis phone numbers, DBT skills, and avoiding isolative behaviors  6. Pt/family to call or e-mail with questions or concerns  7. Pt in agreement with this plan  Risk of Self Harm: Moderate  Risk of Harm to Others: Low  Assets/Strengths/Protective Factors: ability to engage, cognitive skills, creativity, prior successful treatment, sense of humor and suppportive network  Weaknesses/Limitations/Barriers to Treatment: lacks impulse control, lacks judgement and mental illness  Attitudes and Behaviors that require change: Affective instability/mood dysregulation  Self-harming impulses/behaviors  Maladaptive coping strategies  ??  Janace Litten, APRN-CNP  Psychiatric Mental-Health Nurse Practitioner   ??

## 2018-02-10 ENCOUNTER — Ambulatory Visit: Payer: PRIVATE HEALTH INSURANCE | Attending: Psychiatric/Mental Health

## 2018-02-20 ENCOUNTER — Ambulatory Visit: Payer: PRIVATE HEALTH INSURANCE | Attending: Psychiatric/Mental Health

## 2018-02-20 NOTE — Unmapped (Signed)
No show

## 2018-02-26 MED ORDER — lamoTRIgine (LAMICTAL) 25 MG tablet
25 | ORAL_TABLET | ORAL | 0 refills | Status: AC
Start: 2018-02-26 — End: 2018-03-11

## 2018-03-11 ENCOUNTER — Ambulatory Visit: Admit: 2018-03-11 | Payer: PRIVATE HEALTH INSURANCE | Attending: Psychiatric/Mental Health

## 2018-03-11 DIAGNOSIS — F339 Major depressive disorder, recurrent, unspecified: Secondary | ICD-10-CM

## 2018-03-11 MED ORDER — citalopram (CELEXA) 40 MG tablet
40 | ORAL_TABLET | Freq: Every day | ORAL | 1 refills | Status: AC
Start: 2018-03-11 — End: 2018-05-30

## 2018-03-11 MED ORDER — lamoTRIgine (LAMICTAL) 25 MG tablet
25 | ORAL_TABLET | Freq: Every day | ORAL | 1 refills | Status: AC
Start: 2018-03-11 — End: 2018-05-01

## 2018-03-11 MED ORDER — QUEtiapine (SEROQUEL) 100 MG tablet
100 | ORAL_TABLET | Freq: Every evening | ORAL | 1 refills | Status: AC
Start: 2018-03-11 — End: 2018-03-27

## 2018-03-11 NOTE — Unmapped (Signed)
[place]   [facility]  Outpatient Follow-Up Appointment      Name: Tasha Buckley  Date: 03/11/2018  Duration: On 03/11/2018 I met with Tasha Buckley in my office for a 30 minute visit. She was seen face to face. No family members were present for the visit.  Tasha Buckley was seen for pharmacological management and monitoring of depression, anxiety and irritability.  Psychotherapy add on: N/A. Tasha Buckley continues to participate actively in treatment and progress is being made towards the goal of managing her depression, anxiety, and irritabiliy. Tasha Buckley remains safe for outpatient with minimal risks.  Billing Code: 16109    Chief Complaint: I'm starting to feel less irritable.     HPI: Tasha Buckley was calm and relaxed during today's session. She discussed that her irritability is still there, and quite irrational but it hasn't been as bad as it was a few months ago and she's felt more in control of it. When irritable she still has some self-harm thoughts and urges but she has abstained from self-harm and finds the thoughts more and more infrequent. 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 While the transition has been difficult going from laying around the house all day to working a full 40 hours a week Tasha Buckley enjoys what she's doing and feels she's making a difference helping others. No changes in sleep or appetite. Due to continued concerns for weight gain Tasha Buckley intends to join a gym within the next month and get back into a routine of cardio exercise and light weight lifting.    Medications were discussed; Tasha Buckley denied any concerns or side effects after starting Lamictal. She continues to find Celexa helpful for her symptoms of depression and Seroquel has been extremely helpful for sleep. Discussed increasing Lamictal but making no additional changes. No questions  or concerns today.    Current Outpatient Medications   Medication Sig   ??? citalopram Take 1 tablet (40 mg total) by mouth daily.   ??? lamoTRIgine Take 2 tablets (50 mg total) by mouth daily. After 2 weeks take 3 tablets (75 mg total)   ??? QUEtiapine Take 2 tablets (200 mg total) by mouth at bedtime.     No current facility-administered medications for this visit.      Original Presentation to Outpatient Setting (11/06/17):  HPI: The client is a 23 year old Caucasian female with a history of anxiety and depression. Tasha Buckley (prefers the name Tasha Buckley) was calm, cooperative, and open to the therapeutic process during session. Tasha Buckley started the session by discussing that college has always been difficult for her but this semester she completely lost it and made the decision to come home. The previous semester she had to come home early due to her anxiety and during the summer between her freshman and sophomore year she was hospitalized for suicidal ideation. She struggled to identify a trigger for her recent meltdown; she was sitting on her couch and starting looking up jobs that she could get without a college degree and started hyperventilating and called her mother to tell her she wanted to drop out. Since the beginning of this semester she has struggled with the motivation to get up and go to class and she was unable to remember the last time where she had a week of consistent attendance. She described her moods as fluctuating; when she is with her roommates being social she  feels great but when she is alone her thoughts race and she feels depressed. At times she's felt like she's on autopilot. She denies any changes in her appetite or sleep schedule. She discussed that she loves sleep and on days when she feels depressed she will sometimes take over-the-counter sleep aids during the day so she can relax and sleep through it.  ??  When discussing symptoms of depression Tasha Buckley endorsed sad moods, anhedonia,  hypersomnia, difficulty getting out of bed in the morning, decreased energy and concentration, crying spells, passive thoughts of death, poor self-care, low self-esteem, and occasional worsening of her mood when she gets her period. She experiences meltdowns or panic attacks every few months that present with difficulty breathing, racing heart, extreme anxiety, and heightened sensitivity where she will snap at someone. She often feels overwhelmed, restless, and anxious to be around people and meeting new people. Tasha Buckley discussed that she has a family history of bipolar and endorsed several symptoms of mania including distractibility, some difficulty with sleep, increased goal directed activity (like starting a new diet or exercise plan that she never follows through on), flight of ideas, impulsive choices (buying a rat without any previous thought, dyeing her hair 3 times a month, going out late at night for food, seeking adventures, and once going into an abandoned building with a friend), irritability, and anger outbursts. She struggled to identify a timeline or if these were happening during distinct episodes of time. She feels that she has poor impulses and judgement most of the time. She denies that these symptoms may have occurred for 7 days at a time but thinks it's possible that they have occurred 4 days at a time.  ??  Tasha Buckley endorsed some symptoms of ADHD including poor attention to detail, making careless mistakes on her homework or not turning things in, difficulty listening and sustaining attention unless she's very interested in the topic, inability to organize, often losing things, and difficulty sitting still. She denies ever being diagnosed with anorexia or bulimia but that she does have a history of horrible body image and some disordered eating by skipping occasional meals. She denies symptoms of PTSD, OCD, psychosis, paranoia, or delusional thinking. When discussing goals for treatment  Tasha Buckley verbalized that she wants to get back on track by having her medications adjusted, start with a new therapist, get everything lined up for online school next year, and find a new job.    Past Psychiatric History:  Previous inpatient admission: Tasha Buckley was hospitalized once at [facility] from 08/29/16 to 08/31/16 for suicidal ideation and superficial cutting; the admission was triggered by being told by peers that she looked like she had lost weight but when she weighed herself she saw that she actually hadn't lost any weight. She denies any hospitalizations since then. She sought care this month from [facility] but her family ultimately decided not to pursue due to financial reasons.  Previous history of violence to self or others: Tasha Buckley reports a brief history of self-harm by cutting; she started cutting superficially 2 weeks prior to her inpatient admission in 2018. She sometimes cuts now with the most recent episode on 11/01/17. She discussed passive suicidal ideation to drive her car into a pole or overdose on medications but denies any intent. She denies any homicidal ideation or access to firearms.  Protective factors/strengths: Protective factors include her family, friends, and boyfriend. She discussed that after watching '13 Reasons Why' she thought of suicide as almost selfish, and the  aftermath for the ones you care about is horrible. Strengths include her ability to listen to others and be compassionate.  Past/Current Outpatient Treatment: Tasha Buckley was seeing a Publishing rights manager at [facility] [place] [place] for medication management as well as a Paramedic. She has not seen the therapist at all this semester; while she wanted to reach out Tasha Buckley expressed anxiety because she ghosted her therapist last year and didn't feel like explaining it. She is currently seeking a therapist and her primary care provider was prescribing medications until she could meet with a psychiatric  provider.  Current medication regimen: Currently prescribed Celexa 40 mg (started in March of 2018) and Seroquel 50 mg that was started a few days ago.  Past Psychiatric Medication Trials: During her inpatient admission in August of 2018 she was prescribed Wellbutrin XL 150 mg as an augmentation to her Celexa but she stopped taking it a year ago due to no longer finding it helpful. She was prescribed Abilify in the past and found the side effects to be horrible including drooling, akathisia, and some difficulty breathing. She has noticed similar symptoms with the Seroquel (mainly akathisia) but has not found it to be as severe. She denies any additional medication trials.    Substance Use History:  ETOH: Denies regular alcohol use. Tasha Buckley discussed that this was a pretty big issue in high school and that she'd been abstaining from alcohol for the past few years. She drank twice in the last month but required 10+ drinks due to her high tolerance.  MJ: Denies history or current use of marijuana.  Caffeine: Endorses drinking 1 soda daily.  Tobacco: Denies history or current use of tobacco.  Illicits: Denies history or current use of any additional illicit substances.    Allergies to Food, Medications, Products:  No Known Allergies    Past Medical, Surgical, Family History:  Past Medical History:   Diagnosis Date   ??? Anxiety    ??? Depression    ??? Mood swings      Non-psychiatric medications: None.  Most recent labs: Available labs reviewed from August 2018 through chart review.   Chronic illnesses: Tasha Buckley reports a history of one concussion in 2016.  Surgeries: Denies any surgical procedures.     Family History   Problem Relation Age of Onset   ??? Alcohol abuse Mother    ??? Bipolar disorder Mother    ??? Depression Mother    ??? Anxiety disorder Sister    ??? OCD Sister    ??? Learning disabilities Sister      Social History:  Lives with / Family: Tasha Buckley recently moved back in with her parents, who are still married, in [place], [place]. She has one sister (age 58) with a history of OCD and nonverbal learning disorder who lives in [place]. She has one dog, cat, and rat. While she is sad to be leaving her friends behind in [place] she otherwise has not found the transition to be difficult and feels she made the right decision. Her parents have been very supportive through the transition as well.  Development / Childhood: Tasha Buckley grew up in [place] until age 54 and her family moved to [place]. When her father lost his job during her freshman year of college the family moved back to [place] where he was able to find employment again. Tasha Buckley denies any developmental delays or significant childhood stressors.  Education: Tasha Buckley was attending the [employer] [place]. This year  she started her junior year studying human development and family studies. In the spring she intends to start online school through [employer] and to switch her focus to substance abuse counseling. She attended high school in [place].  Occupation: Over the summer Tasha Buckley worked at [employer] and while in college she worked at [employer]. She intends to find work as soon as possible.  Leisure: In her downtime Tasha Buckley enjoys being with her friends, talking to her boyfriend, watching TV and movies (mostly crime dramas and documentaries), and would like to get into horseback riding.  Relationships: Tasha Buckley has been in a relationship with her boyfriend since the summer of 2019; they met each other working at [employer]. His family lives here and he studies Lawyer in [place] state. Tasha Buckley considers her college roommates to be her best friends.  Spiritual: Identifies as [employer]; in the past Tasha Buckley has been highly involved with the [employer] she was attending in [place].  Legal: Reports receiving a ticket for expired tags in 2018.  Military: Denies Financial planner.  Sexuality:  Heterosexual and identifies with birth gender.  Trauma: Tasha Buckley denies any history of sexual, physical, or emotional abuse. She denies any significant losses.  ??  Physical Review Of Systems:  Constitutional: Denies fatigue, malaise, fever, chills.  HEENT: Denies ear/eye pain or drainage, changes in vision or hearing, congestion or throat pain.  Respiratory: Denies difficulty breathing or chest pain, aside from when she experiences a panic attack.  Cardiovascular: Denies palpitations or irregular heart beats.  Gastrointestinal: Denies abd pain, N/V, diarrhea or constipation.  Genitourinary: Denies frequency/urgency or pain upon urination.  Integument/Breast: Denies abnormal lesions or changes in skin integrity.  Hematologic/Lymphatic: Denies clotting issues, increased bruising, swollen or tender lymph nodes.  Musculoskeletal: Denies muscle/joint pain or swelling.  Neurological: Denies headache, tremor, dizziness, change in consciousness.  ??  Objective:  Weight: 203 lbs (taken from visit on 11/06/17).  Vital Signs: There were no vitals filed for this visit.  Integument: No concerning moles or nevi. No areas of impaired skin integrity.  Musculoskeletal: Normal gait and station. Full ROM all extremities.    Mental Status Exam:  General: Appropriately dressed in work appropriate clothing, polite, cooperative, good eye contact. Appears stated age.  Speech: Normal rate, rhythm, articulations. Normal quality. Soft-spoken at times.  Motor: No atrophy or abnormal movements. Normal gait and station.  Mood/ Affect: Mood described as ???better???, affect congruent.  Perception: Denies auditory/visual/tactile/olfactory hallucinations.  Thought Content: Denies delusions, paranoia.   Thought Process: Linear, goal-directed.  Memory: Short and long-term memory grossly intact as evidenced by historical recall of verifiable facts from patient???s history.  Cognitive: Alert and oriented to all spheres, attention/concentration/focus intact and  at patient???s baseline.  Safety: Endorses passive suicidal thoughts. Denies intent, plan. Denies homicidal thoughts, intent plan. Denies plan for self-injury but endorses thoughts.  Insight/ Judgement: Fair insight and limited judgement.  ??  Diagnoses:  Axis I: Major depressive disorder, recurrent episode with mixed features (CMS Dx); rule out bipolar II  Axis II: Deferred   Axis III: None  ??  Assessment/Plan:  1.??Continue current medication regimen of Celexa 40 mg and Seroquel 200 mg. Tasha Buckley was previously aware of side effects to monitor for due to her bad experience with Abilify.??Discussed/reviewed mechanism of action of antipsychotics??(dopamine and for some also serotonin); and the purpose of this medication is to help manage mood and behavioral problems, and some for assistance with sleep. Side effects including  but are not limited to sedation, orthostatic hypotension, dizziness, constipation, dry mouth, agitation, sexual side effects, glucose dysregulation, dyslipidemias, weight gain, increase in breast size or nipple discharge, hyperprolactinemia, drooling, lower the seizure threshold, cardiac arrhythmias, sudden cardiac death,??neuroleptic malignant syndrome (although rare to occur and even more rare with second generation antipsychotics can occur and symptoms include but are not limited to: fever, confusion, rigidity, tachycardia, dysphagia),??extrapyramidal symptoms (side effects including but not limited to: parkinsonism symptoms including??tremor, balance problems, stiffness, slow movement, shuffle gait, ??akathisia or restlessness,??dystonia, and??tardive??dyskinesia. ??We have talked about that tardive dyskinesia (TD) possibly irreversible movements of my mouth, jaw, tongue, hands, feet, or body, discussed more likely to occur with older antipsychotic medicine and that it can occur spontaneously even when someone has never taken these medicines.??Increase Lamictal to 50 mg for 2 weeks then 75 mg for 2 weeks for  symptoms of irritability/mixed features and due to tolerating 25 mg without issue. Discussed risks, benefits, and side effects with pt including but not limited to the mechanism of action of Lamictal and that it is an anticonvulsant/mood stabilizer that is used to treat symptoms of bipolar. Discussed side effects of sedation, dizziness, nausea, possibility of decreased efficacy of oral contraceptives, the importance of compliance, alcohol use, and skin reactions. Discussed with pt Stevens-Johnson Syndrome which is a severe adverse reaction of the skin and mucous membranes and requires immediate medical attention, symptoms often begins with fever and flu-like symptoms and then within a few days a skin rash appears, the skin may blister and peel usually starting on the face and the chest and then spreading. Discussed that Stevens-Johnson syndrome can be life threatening and if symptoms occur report to the emergency room immediately. During the last visit with this clinician Tasha Buckley was given a handout regarding the side effects of Lamictal and how to distinguish a regular rash from a potential Lamictal rash/reaction and additional information was provided to give her mother regarding Lamictal, side effects, and rationale for choice. Reviewed these side effects again and confirmed that Tasha Buckley still has the list of side effects at home  2. Return to clinic in 3-4 weeks for follow-up  3. Pt not interested in starting therapy at this time despite continued encouragement from clinician  4. Pt/family to call or e-mail with questions or concerns  5. Pt in agreement with this plan  Risk of Self Harm: Moderate  Risk of Harm to Others: Low  Assets/Strengths/Protective Factors: ability to engage, cognitive skills, creativity, prior successful treatment, sense of humor and suppportive network  Weaknesses/Limitations/Barriers to Treatment: lacks impulse control, lacks judgement and mental illness  Attitudes and Behaviors that  require change: Affective instability/mood dysregulation, self-harming impulses/behaviors, maladaptive coping strategies  ??  Janace Litten, APRN-CNP  Psychiatric Mental-Health Nurse Practitioner   ??

## 2018-03-27 MED ORDER — QUEtiapine (SEROQUEL) 100 MG tablet
100 | ORAL_TABLET | Freq: Every evening | ORAL | 0 refills | Status: AC
Start: 2018-03-27 — End: 2018-05-08

## 2018-04-04 ENCOUNTER — Ambulatory Visit: Admit: 2018-04-04 | Payer: PRIVATE HEALTH INSURANCE | Attending: Clinical

## 2018-04-04 DIAGNOSIS — F339 Major depressive disorder, recurrent, unspecified: Secondary | ICD-10-CM

## 2018-04-04 NOTE — Unmapped (Signed)
Initial Evaluation    Authentic Name: Tasha Buckley  Gender Identity: female  Pronouns used: she her hers  Sexual Orientation: Heterosexual  Race:  Caucasian     Appt Time: 11:10-11:55    Chief Complaint/Presenting Problem: Pt is being seen by Janace Litten, CNP who recommended therapy. Pt reports difficulty with depression, anxiety, irritability, impulse control, body image.     History of Present Illness:   Pt reports struggling with depression, anxiety, relationships, sense of sense, emotional reactivity since she was young. Pt indicated she had to leave college 3 times related to having break downs. Pt reported history of angry driving, impulsive bxs like getting a tattoo, difficulty following through on tasks, irritability, please pleasing. Pt indicated she is an angry driver and can be easily upset.    Mental Health History:  Depressive symptoms:  on PHQ-9, pt endorsed in the past 2 weeks more than half the days trouble with sleep, low energy, feeling bad about self. Several days little interest in doing things, feeling down, poor eating, moving slowly, and thoughts of being better off dead. Total- 11 (moderate depression).    Anxiety symptoms:  On the GAD-7, pt endorsed in the past 2 weeks over half the days trouble relaxing, restlessness, becoming easily annoyed. Several days feeling nervous, not being able to control worry, worrying about different things. Total- 9 (mild anxiety). Pt reported she worries most about what other people think of her and her body shape/size.     Eating Disorder: Pt reports history of disordered eating. Pt denied history of purging, laxative use, diuretic use, diet pill use, over-exercising. Pt reported either she will hardly eat or eat a lot Pt indicated she will have one meal a day, but it will be a large portion. Pt reported that everyone in her family is overweight and she was never taught how to nurture herself.     Safety Concerns:  Suicidal Ideation:  Pt endorsed passive SI.  Pt reported history of suicidal honking. Pt denied concern for her safety currently. Pt reported history of being hospitalized here for safety.     Family Constellation/History:  Pt indicated she lives with her parents. Pt reported that her relationship with her parents is fine, though she does not have or want an open relationship with them.     Social History/Relationships: Pt reports fears of abandonment and indicated when she left school abruptly the last time, she lost a lot of friends. Pt indicated she is a people pleaser and hates to disappoint others, though also can get irritated by others easily. Pt indicated she was talking with a guy and she quickly thought she wanted to marry him, now does not want to talk to him ever again.     Academic/Employment History: Pt was in school in [place]. Pt now works for [employer] as a Designer, jewellery.     Mental Status:  General                Development: normal               Body Habitus: normal               Grooming/Hygiene: appropriately dressed                Demeanor: polite and cooperative               Eye Contact:  appropriate     Speech  Rate: Normal              Volume: Normal              Articulation: Normal              Quality: Normal                Mood/ Affect              Mood:  consistent               Affect   - Range: normal                          - Reactivity: normal                               - Appropriateness: appropriate to mood and/or situation     Thought               Content: appropriate              Process: normal              Associations: normal              Physical and Psychological Reality Testing: normal     Cognitive              Level of Alertness: normal              Orientation to: time, place, person and situation              Alert and Oriented in all Spheres: yes              Attention/Concentration/Focus: intact     Safety              Harm to Self: no               Harm to Others: no      Insight/ Judgement              Insight: fair              Judgment: limited    Diagnoses:   MDD, Recurrent, Mixed features  Borderline traits       Assessment and Management Plan:  Pt presented to session alone. Writer reviewed [employer] and session structure. Writer reviewed confidentiality and its limits. Clinical research associate provided professional background information. Pt???s irritability, anxiety, and emotion dysregulation appear to be interfering with her functioning and quality of life. Writer believes pt has traits of borderline personality disorder, and likely that diagnosis based on pattern of emotional instability, instability of self and in relationships, impulsivity, thoughts of death, and anger. Writer will continue to assess this. Pt would benefit from weekly ongoing therapy that incorporates DBT framework. Pt would benefit from attending DBT skills class, which will be discussed further at next session. Pt would benefit from continued medication management with Janace Litten, CNP. Writer reviewed these recommendations and pt agreed. Writer also discussed importance of getting in 3 meals and 2 snacks a day.

## 2018-04-04 NOTE — Unmapped (Signed)
OP Treatment Plan    Tasha Buckley  [id number]    [facility] TREATMENT PLAN:   General Goals:  Patient will be able to recognize, accept, and manage his/her mental health and/or substance misuse disorders, including working with the medical staff to manage his/her medications. Improve symptoms/maintain symptom improvement. Improve functioning/maintain functional improvement. Reduce risk of hospital stays and Avoid/Prevent hospitalization. Patient will report improved ability to attend to usual functions and roles. Patient will report stabilization of body weight and image.  Med/Som Specific:  Patient will attend all scheduled appointments with MD/NP. Patient will demonstrate adherence to the medication treatment that is established. Patient will agree to have MD/NP collaborate with other health professional as needed to ensure optimal care.  Therapy Specific:  Patient will attend and be an active participant in individual therapy. Patient will attend and be an active participant in group therapy as recommended. Patient will address psychosocial stresses that impact development of symptoms in therapy. Patient will demonstrate increased knowledge and implementation of coping strategies to reduce symptoms and improve functioning.  Expected Frequency of Visits:  Weekly/Ongoing  Prognosis:  Fair

## 2018-04-14 ENCOUNTER — Ambulatory Visit: Admit: 2018-04-14 | Payer: PRIVATE HEALTH INSURANCE | Attending: Clinical

## 2018-04-14 DIAGNOSIS — F339 Major depressive disorder, recurrent, unspecified: Secondary | ICD-10-CM

## 2018-04-14 NOTE — Unmapped (Signed)
Total Duration: 50 mins   PsychoTherapy Add-on Duration:    N/A   GAD-7:     N/A   PHQ-9:  N/A     Clinical Information/Notes: Pt is being seen by Janace Litten, CNP who recommended therapy. Pt reports difficulty with depression, anxiety, irritability, impulse control, body image.     Psychotherapy Comments: Pt's mood was euthymic and affect congruent. She appropriately engaged with the writer and was open and receptive to feedback. Pt was not concerned about the corona virus to a degree that Clinical research associate provided some psycho education. Writer also reflected pt's ability to remain calm. Writer discussed the DBT approach to therapy, what DBT is, skills class overview, and diary card introduction. We personalized the diary card and pt is to monitor- impulsive bxs, avoidance, binging, angry outbursts.       Diagnosis:  1. Major depressive disorder, recurrent episode with mixed features (CMS Dx)             Assessment and Management Plan: Pt is to complete the diary card daily. Write recommended DBT skills class, however pt will likely not start this until COVID-19 precautions are lifted. Writer assessed for SI and pt denied any thoughts. We will meet again next week.

## 2018-04-23 NOTE — Unmapped (Signed)
Clinician received a call from Tasha Buckley's mother Amy regarding recent behavioral concerns. While [name] has remained medication compliant and denies suicidal ideations she has been extremely irritable and angry due to the need to stay inside regarding concerns of COVID-19. A few days ago she superficially cut herself on her stomach.    This Clinical research associate also spoke with [name] who expressed that she doesn't care about this virus and can't handle being inside. She has been unable to work and hates that her sisters are home because she likes being the only child. Her routine has primarily consisted of sleeping and watching movies. Tasha Buckley feels like she hasn't had space and this has been irritating. She denied suicidal intent during episode of cutting but reported that it felt relieving. When asked if she felt like she needed a PRN medication for anxiety she stated no, I'm just mad. She and her mother do not feel like an inpatient hospitalization is necessary at this time and her mother reports she's doing everything she can to keep the family safe and entertained during the time of social distancing.    Encouraged multiple interventions including trying to limit the amount of news she's taking in, reach out and stay connected with family members and friends through video chatting, utilize a meditation application like Headspace in addition to deep breathing, TIPP skills, keep a regular daily routine, try and keep to a consistent sleep schedule, eat 3 meals every day, find exercises or yoga routines online that can be done in the home, write out thoughts in a journal, and writing down 3 things she's thankful for each day. Discussed the possibility of partial hospitalization program and was encouraged to keep therapy appointment on Friday and make a phone appointment with this clinician for next week. This clinician also discussed the above recommendations directly with Tasha Buckley's mother who was in agreement.

## 2018-04-25 ENCOUNTER — Ambulatory Visit: Admit: 2018-04-25 | Payer: PRIVATE HEALTH INSURANCE | Attending: Clinical

## 2018-04-25 DIAGNOSIS — F339 Major depressive disorder, recurrent, unspecified: Secondary | ICD-10-CM

## 2018-04-25 NOTE — Unmapped (Signed)
This was a video visit (Zoom), including two-way audio and video communication, in lieu of an in-person visit due to COVID-19. The patient provided verbal consent to use the video visit.    I spent 50 mins ([id number]) on this call conducting an interview, performing a limited exam by video, and providing support. In order to protect the safety of the patient and this Clinical research associate while still meeting pt's needs, the pros of telehealth outweigh the cons. Pt is appropriate for telehealth given pt's presenting concerns and stable mental status, ability to keep self safe,and ability to access and use technology.    Total Duration: 50 mins   PsychoTherapy Add-on Duration:    N/A   GAD-7:     N/A   PHQ-9:  N/A     Clinical Information/Notes: Pt is being seen by Janace Litten, CNP who recommended therapy. Pt reports difficulty with depression, anxiety, irritability, impulse control, body image. Writer reviewed the telehealth informed consent including but not limited to risks and benefits to telehealth, disruption plan, emergency plan, privacy and confidentiality, and billing procedure. Pt verified her location in [place], [place]. Pt provided emergency contact information, and verbally consented to treatment. Emergency contact is mother, [phone].      Psychotherapy Comments: Pt's mood was euthymic and affect congruent. She appropriately engaged with the writer and was open and receptive to feedback. Sessio focused on processing her experience of emotion dysregulation and anger in the past week due to adjustments with COVID19. Pt reported episode of self-harm and writer engaged pt in a behavior chain and solution analysis. We discussed open communication, taking time to ground, using cold water. Writer assessed for SI and pt denied any current thoughts. Pt reported passive thoughts on Tuesday. Pt committed to safety and staying alive. We discussed benefits of PHP and pt is going to talk with mom and get back to Clinical research associate.      Diagnosis:  1. Major depressive disorder, recurrent episode with mixed features (CMS Dx)         Assessment and Management Plan: Pt is to complete the diary card daily and utilize behavior chain. Pt will discuss PHP with mom and let this writer and Janace Litten know about decision. We will continue to meet via teleheath until safe to met in person.

## 2018-05-01 ENCOUNTER — Encounter: Admit: 2018-05-01 | Payer: PRIVATE HEALTH INSURANCE

## 2018-05-01 DIAGNOSIS — F331 Major depressive disorder, recurrent, moderate: Secondary | ICD-10-CM

## 2018-05-01 MED ORDER — lamoTRIgine (LAMICTAL) 100 MG tablet
100 | ORAL_TABLET | Freq: Every day | ORAL | 0 refills | Status: AC
Start: 2018-05-01 — End: 2018-05-14

## 2018-05-01 NOTE — Unmapped (Signed)
Group Treatment Note      Patient Name: Tasha Buckley    Start Time: 1400    WSM Stigma (.PHPWSMSTIGMA)  Group leader educated patient about the definition of stigma and discussed how patients have felt stigmatized due to their mental illness. Leader encouraged patients to share with the group experiences in which they felt stigmatized. Leader introduced the concept of self-stigmatization and engaged patients in discussion of how they may experience it. Leader engaged patients in experiential activity to increase empathy.    The patient participated in group and/or other PHP services (as noted separately) during this time. Patient identified a situation they felt stigmatized and how it made them feel. Patient identified a coping strategy to use when this happens.     End Time: 1455    Vivyan Biggers L. Lucretia Roers

## 2018-05-01 NOTE — Unmapped (Signed)
Group Treatment Note      Patient Name: Tasha Buckley    Start Time: 1300    Group Name: Scattergories    Group Objective: Pts. Learned a new game focusing on thinking and writing under pressure with a time limit.     The patient participated in group and/or other PHP services (as noted separately) during this time. Pt. Was appropriate and cooperative. Pt was engaged in game and interacted with peers.       End Time: 1355    Deiona Hooper
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
## 2018-05-01 NOTE — Unmapped (Signed)
Group Treatment Note      Patient Name: Tasha Buckley    Start Time: 1000    DBT Emotion Regulation 4 Ways to Describe Emotion (.PHPDBTEMOTIONREGULATION4)  Educated patients??? in ways to describe emotions. This included raising awareness of prompting and interpreting events, the biological change, expressions, actions and the aftereffects of anger, fear and sadness and happiness.     The patient participated in group and/or other PHP services (as noted separately) during this time.   Patient shared responses with group members and staff.     End Time: 1055    Saachi Zale L. Lucretia Roers

## 2018-05-01 NOTE — Unmapped (Signed)
Follow up in office with:   Janace Litten for medication management and Dr. Manual Meier for therapy.    Estimated day of Discharge from PHP: 05/14/18    RTW plan: n/a

## 2018-05-01 NOTE — Unmapped (Signed)
Tasha Buckley was admitted to [facility], today. She was oriented to the unit by this RN. The rules and guidelines for PHP were reviewed with her. She verbalized understanding and signed a copy of the guidelines for her chart. She was given a signed copy for her records. Tasha Buckley was polite and cooperative with the PHP admission assessment (see below). Tasha Buckley reported her parents and best friend as her support system. Tasha Buckley lives with parents and 2 sisters home from school.  [id number]     ADMISSION ASSESSMENT 05/01/2018   Patient Mental Health Legal Status Voluntary   Person Yes   Place Yes   Time Yes   Situation Yes   BP 142/84   Pulse 96   Resp 16   Temp 98   Temp src 1   SpO2 99   Weight 220 lbs   Height 5' 6   Pain Score 0   Advance Directive Patient does not have advance directive   Information Provided on Healthcare Directives (information about advanced directives, forgoing or with-drawing life-sustaining treatment, and withholding resuscitative services) Yes   Patient Requests Assistance Yes, will do independently   Oriented to Unit -   Oriented to Room -   Oriented to Unit Yes   Oriented to Room Yes   Persons Oriented Patient   Pt demonstrates ability to use call light Yes   Patient Information given Yes   Smoking Policy Explained Yes   Safety Policy Reviewed Yes   Electronics Policy Reviewed Yes   What does Patient Want to Gain From Admission (No Data)   What does Patient Want to Gain From Admission learn skills to deal with emotions   Is this person blind or does he/she have serious difficulty seeing even when wearing glasses? 0   Because of a physical, mental, or emotional condition, do you have difficulty doing errands alone such as visiting a doctor's office or shopping? (61 years old or older)  0   Because of a physical, mental, or emotional condition, do you have serious difficulty concentrating, remembering, or making decisions? (67 years old or older 0   Is this  person able to express their needs and desires? 1   Does this person have difficulty dressing or bathing? (65 years old or older) 0   Dressing Independent   Grooming Independent   Feeding Independent   Bathing Independent   Toileting Independent   In/Out Bed Independent   Does this person have serious difficulty walking or climbing stairs? (79 years old or older) 0   Walks in Home Independent   Weakness of Legs None   Weakness of Arms/Hands None   Is this person deaf or does he/she have serious difficulty hearing? 0   Hearing - Right Ear Functional   Hearing - Left Ear Functional   Assistive Devices Eyeglasses   Apparent Age Appears Actual Age   Hygiene/Grooming Well Groomed   General Attitude Cooperative;Pleasant   Motor Activity Freedom of movement   Eye Contact Appropriate   Facial Expression Anxious   Patient Behaviors Appropriate for situation;Cooperative;Anxious   Impulsivity Normal   Mood Anxious;Appropriate   Affect Responsive   Affect congruent with mood Yes   Self Injurious Thoughts Denies   Self Injurious Behaviors None observed   Family Suicide History No   Thoughts of Harming Others Denies   Do you have access to weapons in the home? No   Current Thoughts of Aggression No   Methods to Calm Down Change  of environment;Talk with staff   History of Falling 0   Secondary Diagnosis 0   Ambulatory Aids 0   Intravenous Therapy/Heparin/Saline Lock 0   Gait/Transferring 0   Mental Status 0   Morse Fall Risk Score 0   How do you manage stress? Excessive sleeping;Reading   How do you manage stress? history of cutting   When under stress, do you become Anxious;Withdrawn   When under stress, do you become isolate   Patient Stress Factors (No Data)   Patient Stress Factors family dynamics, self image   Irritablity 0   Verbal Threats 0   Impulsivity 0   Negative attitude 0   Unwillingness to follow direction 0   Sensitivity to perceived provocation 0   Easily angered when request denied 0   Total Score - DASA 0    Neurological Normal   Sensory Impairment Glasses   Cardiovascular Normal   Respiratory Normal   Musculoskeletal Normal   Gastrointestinal Normal   Genitourinary Normal   Sexuality Normal   Endocrine Normal   Does Patient Have Unusual Eating Patterns? No   Unplanned Weight Loss in Last Three Months 2   Unplanned Weight Gain  in Last Three Months 1   Unplanned Weight Gain  in Last Three Months up 40# last 2 months   Poor Oral Intake for Four or More Days Prior to Admission 2   Difficulty Chewing or Swallowing 2   Pressure Ulcer or Non-Healing Wound 2   Vomiting/Diarrhea/Nausea Greater Than 3 Days 2   Home Tube Feeding or Total Parenteral Nutrition (TPN) 2   Dietitian Consult Needed 2   Uses personal INSULIN PUMP at home for diabetes management 2   Diabetic Education Needed 2   Sleep Pattern Naps during the day   Average Number of Sleep Hours 12   Restful Sleep Yes   Difficulty Falling Asleep No   Difficulty Staying Asleep No   Difficulty Arising Yes (Comment)   Have you had an influenza vaccine this season? Yes   Influenza Vaccine Indicated? -   Has Patient Abused Substances in the Past 7 Days? No

## 2018-05-01 NOTE — Unmapped (Signed)
PHP Treatment Plan    Tasha Buckley  [id number]    PHP Plan of Care:   Admission Date: 05/01/2018  Date Completed ([place] plan): 05/01/18  Proposed Length of Treatment: 10 days  Frequency of Sessions:  5 Times a Week  Plan Name:  General PHP  Problem #1: Tasha Buckley was referred to [facility] by Janace Litten, APRN due to worsening depression, increased cutting, Buckley irritability.  Today, Tasha Buckley reports feelings of wanting to go to sleep Buckley not wake up with no suicidal plans or intent Buckley reports depression is 6/10 Buckley anxiety is 4/10 with 10 being worst.  Tasha Buckley as family dynamics with sisters home from school Buckley self image.  Tasha Buckley as evidenced by stating I don't have any good Buckley skills, recent cutting history, isolating Buckley staying in bed when anxious, having little interest or pleasure in former activities, Buckley feeling bad about self more than half the days Buckley all related to life Buckley Buckley mood.    Goal #1: Tasha Buckley states her goal for PHP as: learn skills to deal with emotions. Tasha Buckley cutting Buckley reading as past Buckley skills.   By the time for discharge (05/14/2018) from [facility], Tasha Buckley while attending PHP to help manage depression Buckley anxiety.     Objective#1: The objective for Tasha Buckley participation in [facility] is to expose her to DBT Buckley CBT Buckley skill strategies so that she may manage her mental wellness effectively.  Tasha Buckley anxiety 3/10 or less, at the time of discharge.    Interventions: PHP RN Raynelle Fanning P) or designee (PHP SW or MHS) will encourage Mariabelen Pressly daily participation in group therapy to include the following:  Community group to assess physical Buckley mental wellness Buckley current level of functioning.    Wrap Up group to assess physical Buckley mental  wellness, current level of functioning, Buckley review safety plan.    Wellness Self Management group to teach: Goal setting, Buckley, Stigma reduction, Positive Thinking, Healthy Boundaries, Self-Care, Problem Solving, Buckley Relapse Preventions skills.    DBT based skills training group to teach mindfulness, interpersonal effectiveness, distress tolerance, Buckley emotional regulation skills.    Addiction group to teach the etiology of addictions, the addictions model, Buckley education regarding personal addictions Buckley how to move toward recovery.    Medication Education, by Laveda Norman APRN, to review benefits, side effects, Buckley criticality of medication compliance.    Enrichment group to encourage spiritual introspection Buckley exploration.    Nutrition group to educate regarding healthy nutrition Buckley weight management.    Recreational Therapy group to educate on the importance of healthy leisure activity, Buckley teach healthy leisure activities.    Individual Session with PHP clinician (Psychiatrist or Psychiatric NP) to address medical/somatic issues, Buckley/or psychotherapy to include physical health monitoring.    Discharge Criteria: Strick will comply with the treatment teams recommendations.    This plan has been discussed with the patient or guardian:  Yes          RN Signature:   Date/Time:    Psychiatrist Signature:  Date/Time:    Higher education careers adviser:  Date/Time:    Patient Signature:  Date/Time:

## 2018-05-01 NOTE — Unmapped (Signed)
[facility]  Partial Hospitalization Program   Initial Psychiatric Note    Name: Tasha Buckley  MRN: [id number]  Duration: 2956-2130    Date of PHP admission: 05/01/2018    Tasha Buckley is a 23 y.o. year old White or Caucasian female who presented to the [facility] Partial Hospitalization Program by way of her OPP c/o worsening anxiety/depression that has led to self-harm. This patient has a history of MDD and r/o Bipolar 2.     Today, the patient rates anxiety 4 out of 10, with 10 being the worst.  The patient rates depression 6 out of 10, with 10 being the worst. Further symptomatology is described below.     When asked what the patient hopes to get out of PHP, the patient states, ???learn skills to deal with emotions.??? 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 She states she identifies with all 9 criteria. We discussed that DBT is the gold standard txt for this. She finds this diagnosis very relieving.     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 She feels she has sensory issues, is very sensitive to certain sounds.      PSYCHIATRIC REVIEW OF SYMPTOMS:     MOOD:   Patient denies: +depressed mood, +anhedonia, +change in appetite, +crying spells, +change in energy, psychomotor changes, +thoughts of death, +change in concentration, +worthlessness, +poor self-care/self-image, +guilt, hopelessness, +feeling overwhelmed, agitation, +poor frustration tolerance, mood lability, +irritability, impulsivity, euphoria, risky behaviors, grandiosity, hyperactivity, excessive spending/gambling, goal-directed behaviors, hyper-religious behaviors, +racing thoughts, excessive speech, mania or hypomania, +angry outbursts, +self-isolation, +poor motivation, vegetative  symptoms    ANXIETY:  Patient denies: +panic attacks, +restlessness, +generalized worry, palpitations, +feeling easily overwhelmed, hyperstartle response, hyperventilation, hyperarousal, flashbacks, paresthesias, agoraphobia, +intrusive thoughts, obsessive thoughts, paranoia, +social anxiety, compulsive behaviors/daily rituals, purging/binging/restricting, self-mutilation, +avoidances, preoccupation with somatic /unexplained illnesses, phobias or dissociation.   +Most recent panic attack was 1 week ago, she was watching a triggering movie, it was about suicide. She usually cries during her panic attacks.    SLEEP:   Patient denies: insomnia, prolonged sleep latency, nightmares, +hypersomnolence, multiple awakenings, early awakenings, decreased requirements for sleep, sleep apnea, parasomnia, recent sleep study, restless legs  +Up to 12 hours a day, she feels rested with good energy when she wakes up.    PSYCHOSIS:   Patient denies presently: AVH, delusions, preoccupations or paranoia, Catatonia, Negative symptoms    COGNITION:   Patient denies: +Limited mental stamina, memory loss, decrease in ST memory, Poor task completion, often losing items, anomia (unable to recall the names of everyday objects), agnosia (inability to interpret sensations), apraxia (difficulty with the motor planning to perform tasks or movements when asked), feeling hyperfocused or +distractible, unproductive multi-tasking, inattentive to tasks that are disinteresting, Sundowners, disturbance in executive functions (planning, organizing, paying bills)   +Pt reports this is not problematic currently b/c she's not working      Previous PSYCHIATRIC HISTORY:    Previous inpatient admission: [name] was hospitalized once at [facility] from 08/29/16 to 08/31/16 for suicidal ideation and superficial cutting; the admission was triggered by being told by peers that she looked like she had lost weight but when she weighed herself she saw that she actually hadn't  lost any weight. She denies  any hospitalizations since then. She sought care last fall from [facility] but her family ultimately decided not to pursue due to financial reasons.    Previous history of violence to self or others: [name] reports a brief history of self-harm by cutting; she started cutting superficially 2 weeks prior to her inpatient admission in 2018. She sometimes cuts now with the most recent episode on 11/01/17. She discussed passive suicidal ideation to drive her car into a pole or overdose on medications but denies any intent. She denies any homicidal ideation or access to firearms.    Protective factors/strengths: Protective factors include her family, friends, and boyfriend. She discussed that after watching '13 Reasons Why' she thought of suicide as almost selfish, and the aftermath for the ones you care about is horrible. Strengths include her ability to listen to others and be compassionate.    Past/Current Outpatient Treatment: [name] was seeing a Publishing rights manager at [facility] [place] for medication management as well as a Paramedic. She currently sees Janace Litten for medication management and Dr. Manual Meier for therapy.    Past Psychiatric Medication Trials: During her inpatient admission in August of 2018 she was prescribed Wellbutrin XL 150 mg as an augmentation to her Celexa but she stopped taking it. She was prescribed Abilify in the past and found the side effects to be horrible including drooling, akathisia, and some difficulty breathing. She has noticed similar symptoms with the Seroquel (mainly akathisia) but has not found it to be as severe. She currently takes LTG and  Celexa.     Denies any previous course of PHP, ECT, TMS, or ketamine.    Previous GeneSight testing: denies      ADDICTIONS & MALADAPTIVE BEHAVIORS:    Patient denies the following maladaptive coping mechanisms:  Tobacco, ETOH, BZO, OPI, THC, stimulants, caffeine, energy drinks,  social media, gambling, porn, sex, pyromania, shopping, trichotillomania, food or sugar, cutting, burning, skin-picking    Endorses drinking 1 soda daily.  [name] discussed that this was a pretty big issue in high school Denies regular alcohol use, but does report occ ETOH binges. Most recent last week. In the past she has required 10+ drinks due to her high tolerance.    Patient denies tolerance, withdrawal, guilt, inability to stop, ongoing use despite consequences, Preoccupation with obtaining substance, escalating tolerance, lack of functioning, responsibilities, social or occupational functioning, Legal consequences  Patient denies h/o seizures, psychosis, withdrawal, cravings, blackouts related to drug use    Denies current withdrawal symptoms: piloerection, tremor, diaphoresis, HA, nausea, muscle cramping, yawning, pupil dilation, diarrhea, rhinorrhea, hallucinations.    IV Drug Abuse: denies  Previous Substance Abuse Treatment: denies      SOCIAL DEVELOPMENTAL HISTORY     Per record review:     Lives with / Family: [name] recently moved back in with her parents, who are still married, in [place], [place]. She has one sister (age 70) with a history of OCD and nonverbal learning disorder who lives in [place]. She has one dog, cat, and rat. While she is sad to be leaving her friends behind in [place] she otherwise has not found the transition to be difficult and feels she made the right decision. Her parents have been very supportive through the transition as well.    Development / Childhood: [name] grew up in [place] until age 30 and her family moved to [place]. When her father lost his job during her freshman year of college the family  moved back to [place] where he was able to find employment again. [name] denies any developmental delays or significant childhood stressors.    Education: [name] was attending the [employer], [place]. This year she started her junior year  studying human development and family studies. In the spring she intends to start online school through [employer] and to switch her focus to substance abuse counseling. She attended high school in [place].    Occupation: Over the summer [name] worked at [employer] and while in college she worked at [employer]. Pt now works for [employer] as a Designer, jewellery.     Leisure: In her downtime [name] enjoys being with her friends, talking to her boyfriend, watching TV and movies (mostly crime dramas and documentaries), and would like to get into horseback riding.    Relationships: [name] has been in a relationship with her boyfriend since the summer of 2019; they met each other working at [employer]. His family lives here and he studies Lawyer in [place] state. [name] considers her college roommates to be her best friends.    Spiritual: Identifies as Ephriam Knuckles; in the past [name] has been highly involved with the [employer] she was attending in [place].    Legal: Reports receiving a ticket for expired tags in 2018.    Military: Denies Financial planner.    Sexuality: Heterosexual and identifies with birth gender.    Trauma: [name] denies any history of sexual, physical, or emotional abuse. She denies any significant losses.      FAMILY MEDICAL AND PSYCHIATRIC HISTORY:     Family History   Problem Relation Age of Onset   ??? Alcohol abuse Mother    ??? Bipolar disorder Mother    ??? Depression Mother    ??? Anxiety disorder Sister    ??? OCD Sister    ??? Learning disabilities Sister      Medication trials for family:  denies knowledge of any successful trials      Medical ROS:  The patient denies weakness, fever, chills, unintentional weight loss, night sweats, heat/cold intolerance, recent travel, recent injury or trauma. Denies change in appetite, +sleep, bowel or bladder habits.  CARDIOVASCULAR: The patient denies chest pain, irregular heartbeats, palpitation,  shortness of breath, dyspnea, edema, difficulty breathing at night, cramps in legs with walking.  RESPIRATORY: The patient denies cough, sputum, hemoptysis, dyspnea, wheezing  GASTROINTESTINAL: The patient denies nausea, vomiting, difficulty in chewing or swallowing, indigestion, poor appetite, constipation, diarrhea, or abdominal pain. States regular bowel movements  MUSCULOSKELETAL: The patient denies arm, buttock, thigh or calf cramps. Denies numbness/tingling. No muscle weakness or tenderness. No joint swelling.  SKIN: The patient denies easy bruising, skin redness, skin rash, lumps, sores, changes in skin color.  NEUROLOGIC: CN II - XII intact. The patient denies headache, dizziness, syncope, loss of consciousness, vertigo, nausea, emesis, tinnitus, parathesias. Patient denies changes in eyesight, speech, swallowing, or hearing. Denies change in memory, gait, or coordination. Denies neuropathy, recent trauma, injury or aphasia. Denies change in orientation or muscle spasms.  OTHER SYSTEMS: Reviewed and are negative  Social history and medical problems have been reviewed.      PHYSICAL EXAM:  Constitutional: Level of Distress: NAD. Ambulation: ambulating normally.  Head/Face: atraumatic.  Musculoskeletal: Joints, Bones, and Muscles: no contractures, malalignment, tenderness, or bony abnormalities and normal movement of all extremities. Extremities: no cyanosis, edema, varicosities, or palpable cord.  Neurologic: Gait and Station: normal gait  and station. Cranial Nerves: grossly intact. Sensation: grossly intact. Coordination and Cerebellum: no tremor.  Skin: Inspection and palpation: no rash, lesions, ulcer, induration, jaundice, or piloerection and good turgor. Nails: normal.      PAST MEDICAL HISTORY  Past Medical History:   Diagnosis Date   ??? Anxiety    ??? Concussion     mild, age [age], fell on treadmill at gym   ??? Depression    ??? Mood swings        ALLERGIES  No Known Allergies      SURGICAL HISTORY:  No  past surgical history on file.      PCP and other specialists:   Attending Provider Unknown      VITAL SIGNS:    Vitals:    05/01/18 0815   BP: 142/84   Pulse: 96   Resp: 16   Temp: 98 ??F (36.7 ??C)   SpO2: 99%       Patient reports pain to be: 0 out of 10, with 10 being the worst pain.    Body mass index is 35.51 kg/m??.      ECG 12 lead   No results found.      Laboratory results from Oct 2019 stay reviewed.       CURRENT MEDICATIONS  Current Outpatient Medications   Medication Sig   ??? citalopram Take 1 tablet (40 mg total) by mouth daily.   ??? lamoTRIgine Take 2 tablets (50 mg total) by mouth daily. After 2 weeks take 3 tablets (75 mg total)   ??? QUEtiapine Take 2 tablets (200 mg total) by mouth at bedtime.     No current facility-administered medications for this visit.          MENTAL STATUS EXAMINATION:    Location of interview: in office  APPEARANCE: well-groomed / appropriate hair, makeup, jewelry, clothing / eyeglasses in place / tattoos noted to LFA  WEIGHT: overweight body habitus    GAIT: normal gait and station  PSM activity: normal & calm  WAKEFULNESS: alert   ORIENTATION: Patient oriented to person, place, time, situation, president.  Eye contact: good  SPEECH/LANGUAGE: normal pace, volume, rhythm   MOOD: pleasant/ euthymic but anxious  AFFECT: mood congruent  STREAM of mental activity/LANGUAGE: coherent / logical / relevant / linear / Reaction time to questions WNL / fair concentration  THOUGHT CONTENT: no overt psychosis, AVH, delusional content   ATTITUDE TO EXAMINER: interested, attentive, friendly, cooperative   IDEATIONS: denies active SI or HI / able to discuss/agree to safety plan / CFS / RFL / future-oriented  MEMORY: recent and remote intact   COMPREHENSION/UNDERSTANDING: adequate   FUND OF KNOWLEDGE: normal  JUDGEMENT: intact   INSIGHT: needs improvement  RELIABILITY: good historian      DIAGNOSIS/IMPRESSION:    MDD, recurrent, moderate  Cluster B PD  GAD  SIB, last episode 1 week ago  H/o ETOH  use, binge pattern      Assets/Strengths/Protective Factors:  employment history, self-expressions, sense of hope, sense of humor and supportive network    Weaknesses/Limitations/Barriers to Treatment:  lacks insight and mental illness    Attitudes and Behaviors that require change:  Affective instability/mood dysregulation  Self-harming impulses/behaviors  Suicidal ideation  Maladaptive coping strategies      Management PLAN:     Admit to PHP for an estimated 10 day stay. [name] Suicide Severity rating = low. PHQ-9 today = 10    Monitor symptoms and side effects and adjust medication doses accordingly  Monitor safety and adjust level of care to least restrictive level as clinically indicated    Medication plans will include:  I know medicine isn't a fix-all, but pt feels her regimen is very helpful.  Continue Seroquel 200mg  hs  INCREASE LTG to 100mg  daily  Continue Celexa 40mg  daily    Follow up in office with:   Janace Litten for medication management and Dr. Manual Meier for therapy.    Estimated day of Discharge from PHP: 05/14/18    Most recent physical with PCP reported by pt to be October 2019.     RTW plan: n/a    She reports she is living at parents' home, all sharp objects have been locked away. She reports her medications are locked away also, and her mother is dosing pt nightly.     Laveda Norman, MSN, APRN, PMHNP-BC  Psychiatric Nurse Practitioner  Board Certified in Internal Medicine, Psychiatry/Mental Health, and Addictions    -      Treatment goals include: Establish safe behaviors, engage in safety plan, Stabilize mood symptoms, Improve ability to meet basic needs, Reduce psychosis, Improve medication and treatment adherence, Provide psychoeducation, Supportive care for substance withdrawal, Improve coping, Reduce anxiety symptoms    All side effects, risks, benefits, and alternatives discussed; patient states understanding and agreement with current course of treatment. Treatment plan was reviewed  with the patient who is in agreement. Patient was educated on appropriate expectations as to the timing and efficacy of psychotropic medications (at least 4 weeks with antidepressants, etc.)    Decision making complexity: moderate to high. Components considered and discussed: patient safety, differential diagnosis, previous medication trials, risks of medication complications, risk of symptom relapse, and medication side effects.

## 2018-05-01 NOTE — Unmapped (Addendum)
Group Treatment Note      Patient Name: Tasha Buckley    Start Time:??1455  Wrap Up Group:  ??  Group Description: This clinician assessed patients' physical and mental wellness via group discussion and review of daily check out document. Assessed patients' for presence of suicidal and/or homicidal ideations, plans or intentions. Assessed and discussed patients' safety plans and addressed patient concerns.  ??  [name]??completed check out sheet, denied SI/HI and verbalized safety plan.??She??did not need to speak to staff 1:1 and took all belongings and checked out of group space.  ??  ??  End Time:??1500

## 2018-05-01 NOTE — Unmapped (Signed)
Pt did attend the 0900 community meeting group from [phone].  Pt then met with CNP from [phone].  Pt did return to group from [phone].  Pt was active and engaged in discussion when present during group.

## 2018-05-01 NOTE — Unmapped (Signed)
Group Treatment Note      Patient Name: Tasha Buckley    Start Time: 1100    DBT Emotion Regulation 6 Ways to Describe Emotions   Educated patients??? in ways to describe emotions. This included raising awareness of prompting and interpreting events, the biological change, expressions, actions and the aftereffects of sadness, shame, guilt and love.      The patient participated in group and/or other PHP services (as noted separately) during this time. Patient shared responses with group members and staff.     End Time: 1155    [name]

## 2018-05-02 ENCOUNTER — Ambulatory Visit: Payer: PRIVATE HEALTH INSURANCE | Attending: Clinical

## 2018-05-02 ENCOUNTER — Encounter: Admit: 2018-05-02 | Payer: PRIVATE HEALTH INSURANCE

## 2018-05-02 DIAGNOSIS — F331 Major depressive disorder, recurrent, moderate: Secondary | ICD-10-CM

## 2018-05-02 NOTE — Unmapped (Signed)
Group Treatment Note      Patient Name: Tasha Buckley    Start Time: 1000    DBT Emotion Regulation 2 The Functions of Emotions (.PHPDBTEMOTIONREGULATION2)  Taught patients the 3 main functions of emotions; communication, motivation and validation and guided patients in the application of concepts via engaging in classroom worksheet. Reviewed myths about emotions worksheet and engaged patients??? in classroom discussion.      The patient participated in group and/or other PHP services (as noted separately) during this time. Patient participated in completing the 3 Main jobs of Emotions Worksheet. Patient shared responses with group members about myths and challenges of emotions.     End Time: 1055    Melissa Pulido L. Lucretia Roers
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
## 2018-05-02 NOTE — Unmapped (Signed)
Group Treatment Note      Patient Name: Asia Dusenbury    Start Time: 1100    Managing Guilt  Discuss with patients how to use guilt in an effective way.  Discuss how guilt, if used productively and responsibly, is a feeling that helps [place] to stay true to our principles.  When used destructively, it does not teach [place] anything and only punishes and paralyzes [place].  Ask patients to be true to themselves and identify if they willfully and purposefully did something which caused their guilt.  Or is the feeling more of a sign of their ability to be sensitive to others??? feelings (empathy)?  Or is their feeling of guilt a learned reaction to when something bad happens, it???s best to assume it???s their fault?      Discuss with patients if their punishment fits the crime.  Do they feel they are punishing themselves beyond what they would feel is reasonable?  Challenge them to think, is it the same punishment I would suggest a friend choose for themselves?   Will the punishment help me learn or just make me convinced I???m a bad person?  Also discuss, what guilt has taught them.  Responsible guilt, when analyzed, will help [place] better understand the underlying traits and virtues that we need to work on.      Pt fully participated in programming.  Pt engaged in group discussion, asking appropriate questions and providing positive feedback to peers.    The patient participated in group and/or other PHP services (as noted separately) during this time.      End Time: 1155    [name]

## 2018-05-02 NOTE — Unmapped (Signed)
Group Treatment Note      Patient Name: Tasha Buckley    Start Time: 1300    Group Name: Exploring Spirituality    Group Objective: Participants will become aware of research findings on the benefits of spiritual/religious practice for health and well-being; be provided a definition of spirituality and how it may differ from being religious; reflect on the role of spiritual/religious beliefs and practices in helping people to cope with adversity and find peace; be able to articulate at least seven strategies for creating and maintaining a balanced and healthy spirituality.    Participation: The patient participated in group and/or other PHP services (as noted separately) during this time. Patient completed worksheet and shared with group.    End Time: 1355    Nylani Michetti

## 2018-05-02 NOTE — Unmapped (Signed)
Group Treatment Note      Patient Name: Tasha Buckley    Start Time: 1400    WSM Experiential Mindfulness   Group leader engaged patients in mindfulness activity of practicing experiential wise mind.  Patients were instructed to focus on the task of responding to their environment as it relates to their 5 senses, does the environment inspire calmness or agitation etc.  Patients were encouraged to practice intentionally focusing on deep breathing techniques and progressive relaxation as skills available to increase effective coping and distress tolerance.      The patient participated in group and/or other PHP services (as noted separately) during this time. Patient fully engaged in group learning of progressive relaxation exercises combined with deep breathing techniques.  Patient completed two sets of progressive relaxation/deep breathing exercises and then shared if they felt an increase in their level of calmness and peacefulness.  Patient shared how feeling more calm and peaceful would benefit them as they pursue individual recovery and stability.    End Time: 1455    Jakari Sada L. Lucretia Roers

## 2018-05-05 ENCOUNTER — Encounter: Admit: 2018-05-05 | Payer: PRIVATE HEALTH INSURANCE

## 2018-05-05 DIAGNOSIS — F331 Major depressive disorder, recurrent, moderate: Secondary | ICD-10-CM

## 2018-05-05 NOTE — Unmapped (Deleted)
Group Treatment Note      Patient Name: Tasha Buckley    Start Time: 1300    Group Name: Stronger than Stigma    Group Objective: As a result of this group the patients will be able to understand the issue of stigma in mental health; articulate how stigma impacts their lives; understand some strategies for dealing with the issue of stigma; offer some strategies of their own to deal with stigma in their recovery.    The patient participated in group and/or other PHP services (as noted separately) during this time.    Patient listened attentively and participated in group discussion. Patient received and reviewed handout.    End Time: 1355    GARY COOPER

## 2018-05-05 NOTE — Unmapped (Signed)
Group Treatment Note      Patient Name: Tasha Buckley    Start Time: 1000    W[facility] Coping Skills (.PHPWSMCOPINGSKILLS)  Group leader educated patient about common causes of stress. Leader assisted patients to identify common signs of stress that they experience. Leader discussed the roles and functions that maladaptive coping strategies play to increase stress. Leader taught patient skills to prevent common stressors from occurring. Leader engaged patients in a creative activity to practice healthy coping skills. Leader taught relaxation techniques as healthy ways to cope with stress. Leader engaged patient in experiential exercises to illustrate relaxation techniques. Leader discussed with patients additional healthy coping skills and how to utilize them in daily life.    The patient participated in group and/or other PHP services (as noted separately) during this time.  Patient was able to identify signs of personal stress and how they manifest.  Patient practiced relaxation skills in group. Patient was able to identify 3 new healthy coping skills to try.     End Time: 1055    Rakwon Letourneau L. Lucretia Roers

## 2018-05-05 NOTE — Unmapped (Signed)
Group Treatment Note      Patient Name: Tasha Buckley    Start Time: 0900    Community Group (.PHPCOMMUNITYGROUP)  This clinician assessed patients??? physical and mental wellness via group discussion and review of daily check in document. Clinician also assessed patients' for presence of suicidal and /or homicidal ideation, plans or intentions and addressed patient concerns.    The patient participated in group and/or other PHP services (as noted separately) during this time. Patient completed check in sheet, denied SI/HI. Patient verbalized positive action taken in the past 24 hours and shared how time was spent.      Pt reports having a good weekend.  Pt states she spent most of the weekend hanging out with her family and watching movies.  Pt states she had dinner with her family both nights.  Pt reports she slept well throughout the weekend except for last night, I only got about 4-5 hours.  Pt reports she felt really anxious last night because the guy I am talking to didn't respond to my texts.  Pt currently denies any struggles or issues to address with the treatment team.  Pt remains invested in treatment.      End Time: 0955    [name]

## 2018-05-05 NOTE — Unmapped (Signed)
Group Treatment Note      Patient Name:  Tasha Buckley    Start Time: 1300    Group Name: Stronger than Stigma    Group Objective: As a result of this group the patients will be able to understand the issue of stigma in mental health; articulate how stigma impacts their lives; understand some strategies for dealing with the issue of stigma; offer some strategies of their own to deal with stigma in their recovery.    The patient participated in group and/or other PHP services (as noted separately) during this time.    Patient listened attentively and participated in group discussion. Patient received and reviewed handout.    End Time: 1355    GARY COOPER

## 2018-05-05 NOTE — Unmapped (Signed)
Group Treatment Note      Patient Name: Tasha Buckley    Start Time: 1455  Wrap Up Group:     Group Description: This clinician assessed patients' physical and mental wellness via group discussion and review of daily check out document. Assessed patients' for presence of suicidal and/or homicidal ideations, plans or intentions. Assessed and discussed patients' safety plans and addressed patient concerns.    Group Objective: Assess patients' mental and physical wellness. Assess patients for presence of suicidal or homicidal thoughts. Assessed patients' access to and ability to follow safety plan.     Tasha Buckley completed check out sheet, denied SI/HI and verbalized safety plan. She did not need to speak to staff 1:1 and took all belongings and checked out of group space.           End Time: 1500      Josiah Lobo

## 2018-05-05 NOTE — Unmapped (Signed)
Group Treatment Note      Patient Name: Tasha Buckley    Start Time: 1100    DBT Emotion Regulation 7 ABC PLEASE Build Mastery  Taught patients??? emotion regulation skill of reducing vulnerability to emotion mind utilizing the ABC PLEASE acronym.  Engaged patients??? in creating a personalized plan for staying out of emotion mind and elaborated on PLEASE and Building mastery.    The patient participated in group and/or other PHP services (as noted separately) during this time. Patient asked questions, listened attentively to peers and presenter and engaged in learning the material.  Patient participated in the group process and created an individualized plan utilizing the ???PLEASE??? acronym and Building mastery. Patient created an individualized 24 hour clock so that they could plan effectively to accomplish/incorporate all of the areas covered during this segment of PHP.    End Time: 1155    [name]

## 2018-05-05 NOTE — Unmapped (Signed)
Group Treatment Note      Patient Name: Tasha Buckley    Start Time: 1400    [facility] Positive & Negative Thinking (.PHPWSMPOSANDNEGTHINKING)  Group Leader educated patients about the connected relationship between thoughts, feelings and behaviors. Leader utilized CBT ???thought triangles??? as a visual representation of the above concept. Leader engaged patients in creating their own personal thought triangle and sharing with a peer 1:1.  Patients were also given the opportunity to share with the large group.    The patient participated in group and/or other PHP services (as noted separately) during this time.  Patient created personal thought triangle and shared with peer 1:1.    End Time: 1455    Sloan Takagi L. Lucretia Roers

## 2018-05-06 ENCOUNTER — Encounter: Admit: 2018-05-06 | Payer: PRIVATE HEALTH INSURANCE

## 2018-05-06 DIAGNOSIS — F331 Major depressive disorder, recurrent, moderate: Secondary | ICD-10-CM

## 2018-05-06 NOTE — Unmapped (Signed)
Group Treatment Note      Patient Name: Tasha Buckley    Start Time: 1400    WSM Life Roles (.PHPWSMLIFEROLES)  Group leader explained life roles and how these affect daily life. Leader discussed different responsibilities that each life role carries and the importance of completing them. Leader introduced an activity where patient identified and made a list of roles that they engage in on a regular basis.     The patient participated in group and/or other PHP services (as noted separately) during this time. Patient divided the activity into squares; then labeled their roles and titles within these spaces. Patient identified the roles in which they favor the most and the least.     End Time: 1455    Keonia Pasko L. Lucretia Roers

## 2018-05-06 NOTE — Unmapped (Signed)
Group Treatment Note      Patient Name: Tasha Buckley    Start Time: 1100    DBT Emotion Regulation 9  Taught and elaborated on the A of the ABC???s; accumulating positive emotions in the LONG term.  Patients were taught the emotion regulation skill of accumulating positive emotions in the LONG term via action values. Patients??? were taught about values and priorities. Patients??? were engaged in the Action Value worksheets and Priority List.  Patients??? were guided in the process of completing the worksheets for setting specific goals to accomplish an action value.     The patient participated in group and/or other PHP services (as noted separately) during this time. Patient identified important life values; created an action plan and timeline as well as the patient defined specific steps to meet their goals, a plan to address and cope with obstacles and ways to access support and encouragement.   Patient shared responses with staff and group members.     End Time: 1155    [name]

## 2018-05-06 NOTE — Unmapped (Signed)
Group Treatment Note      Patient Name: Tasha Buckley    Start Time: 0900    Community Group (.PHPCOMMUNITYGROUP)  This clinician assessed patients??? physical and mental wellness via group discussion and review of daily check in document. Clinician also assessed patients' for presence of suicidal and /or homicidal ideation, plans or intentions and addressed patient concerns.    The patient participated in group and/or other PHP services (as noted separately) during this time. Patient completed check in sheet, denied SI/HI. Patient verbalized positive action taken in the past 24 hours and shared how time was spent.      Pt reports having a good evening.  Pt states she hung out with her best friend last night.  Pt states we went to a park, hung out, and talked for a while.  Pt states her friend now has a boyfriend and she is worried she will start ditching me for her boyfriend.  Pt states, I'm mad because she gets to have someone and I don't.  Pt reports she slept well last night.  Pt currently denies any struggles or issues to address with the treatment team.  Pt remains invested in treatment.        End Time: 0955    [name]

## 2018-05-06 NOTE — Unmapped (Signed)
Group Treatment Note      Patient Name: Tasha Buckley        Nutrition note: Choose My Plate.  Educated on Choose  My Plate. Discussed food groups and number of servings and proper serving sizes. Discussed carbohydrates,proteins and lipids and roles of each defined.Encouraged to choose meals based on nutrition in my plate. The importance of nutrition for good mental health discussed.. Discussed how food affects the neurotransmitters in the brain and the importance of healthy fats and the role healthy fats play.    Objective:To understand the importance of consuming foods from the five food groups and the role it plays in our daily diet.    Date: 05/06/2018  Time: 0981-1914    Attendance: Attended    Interactions: Interacted appropriately    Mood/Affect: Appropriate    Patient Participation: Hilda Lias Tiger Spieker,DTR

## 2018-05-06 NOTE — Unmapped (Signed)
Group Treatment Note      Patient Name: Tasha Buckley    Start Time:??1455  Wrap Up Group:  ??  Group Description: This clinician assessed patients' physical and mental wellness via group discussion and review of daily check out document. Assessed patients' for presence of suicidal and/or homicidal ideations, plans or intentions. Assessed and discussed patients' safety plans and addressed patient concerns.  ??  Group Objective: Assess patients' mental and physical wellness. Assess patients for presence of suicidal or homicidal thoughts. Assessed patients' access to and ability to follow safety plan.  ??  [name]??completed check out sheet, denied SI/HI and verbalized safety plan. She??did not need to speak to staff 1:1 and took all belongings and checked out of group space.  ??  ??  ??  End Time:??1500

## 2018-05-06 NOTE — Unmapped (Signed)
Group Treatment Note      Patient Name: Tasha Buckley    Start Time: 1000    DBT Emotion Regulation 8  Taught and elaborated on the A of the ABC???s; Accumulating Positive Emotions in the SHORT term. Patients??? were taught the emotion regulation skill of accumulating positive emotions via of choosing positive experiences and positive events.  Patients were taught to be mindful of positive experiences and be unmindful of worries.  Guided patients??? in interacting with the Adult Pleasant Events Schedule.     The patient participated in group and/or other PHP services (as noted separately) during this time. Pt completed the Adult Pleasant Events Schedule for this current month as well as 1 pleasant events for each week that occurs during the upcoming 90 days.  Patient shared responses with staff and group members.    End Time: 1055    Ty Buntrock L. Lucretia Roers

## 2018-05-07 ENCOUNTER — Encounter: Admit: 2018-05-07 | Payer: PRIVATE HEALTH INSURANCE

## 2018-05-07 DIAGNOSIS — F331 Major depressive disorder, recurrent, moderate: Secondary | ICD-10-CM

## 2018-05-07 NOTE — Unmapped (Signed)
Group Treatment Note      Patient Name: Tasha Buckley    Start Time: 1000    DBT Emotion Regulation 10 Opposite to Emotion Action  Taught patients??? the emotion regulation skill of letting go of emotional suffering via mindfulness of your current emotion and opposite action.  Engaged patients??? in ???opposite to emotion action??? worksheet to reinforce concepts taught.  Reviewed prior emotion regulation modules and answered patient questions regarding emotion regulation skills.    The patient participated in group and/or other PHP services (as noted separately) during this time. Patient participated in class discussion and completed opposite action worksheet.  Patient shared responses with staff and group members.      End Time: 1055    Kianna Billet L. Lucretia Roers

## 2018-05-07 NOTE — Unmapped (Signed)
Group Treatment Note      Patient Name:  Tasha Buckley    Start Time: 1400    [facility] Experiential Mindfulness   Group leader engaged patients in mindfulness activity of practicing experiential wise mind.  Patients were instructed to focus on the task of responding to their environment as it relates to their 5 senses, does the environment inspire calmness or agitation etc.  Patients were encouraged to practice intentionally focusing on deep breathing techniques and progressive relaxation as skills available to increase effective coping and distress tolerance.      The patient participated in group and/or other PHP services (as noted separately) during this time. Patient fully engaged in group learning of progressive relaxation exercises combined with deep breathing techniques.  Patient completed two sets of progressive relaxation/deep breathing exercises and then shared if they felt an increase in their level of calmness and peacefulness.  Patient shared how feeling more calm and peaceful would benefit them as they pursue individual recovery and stability.    End Time: 1455    Shanavia Makela L. Wood

## 2018-05-07 NOTE — Unmapped (Signed)
Group Treatment Note      Patient Name: Tasha Buckley    Start Time: 1455    Wrap Up Group (.[id number])  This clinician assessed patients??? physical and mental wellness via group discussion and review of daily check out document. Assessment for presence of suicidal/homicidal ideations plans or intentions completed. Assessed and discussed patient's safety plans and addressed patient concerns.    Patient completed check out sheet; denied SI/HI and verbalized safety plan. Patient did not need to speak to staff 1:1 and took all belongings & checked out of group space.    End Time: 1500    [name]

## 2018-05-07 NOTE — Unmapped (Signed)
Group Treatment Note      Patient Name: Tasha Buckley    Date: 05/07/2018         Start: 1:00pm     End: 1:55pm     Group Type: Addictions    Group Name: Roots      Group Objective: Patients would participate in an activity where they identified underlying causes/ contributing factors to addiction. Patients would identify and talk how these factors can lead to relapse recovery.    Attendance: Attended     Interactions: Interacted appropriately     Mood/Affect: Appropriate      Patient Participation: The patient participated in-group and/or other PHP services (as noted separately) during this time.  Pt informed that Mearl Latin, [facility], is supervising this writer    Laurence Ferrari, LCDC III

## 2018-05-07 NOTE — Unmapped (Signed)
Group Treatment Note      Patient Name: Tasha Buckley    Start Time: 0900    Community Group (.PHPCOMMUNITYGROUP)  This clinician assessed patients??? physical and mental wellness via group discussion and review of daily check in document. Clinician also assessed patients' for presence of suicidal and /or homicidal ideation, plans or intentions and addressed patient concerns.    The patient participated in group and/or other PHP services (as noted separately) during this time. Patient completed check in sheet, denied SI/HI. Patient verbalized positive action taken in the past 24 hours and shared how time was spent.      Pt reports having a good evening.  Pt reports she went home and took like a three hour nap.  I didn't mean to sleep for that long.  Pt reports she had trouble falling asleep at night due to sleeping too much in the afternoon.  Pt reports she ate dinner and watched some TV prior to going to bed.  Pt currently denies any struggles or issues to address with the treatment team.  Pt remains invested in treatment.      End Time: 0955    [name]

## 2018-05-07 NOTE — Unmapped (Signed)
Group Treatment Note      Patient Name: Tasha Buckley    Start Time: 1100    DBT Distress Tolerance 1 Crisis Survival Skills & TIP (.PHPDBTDISTRESSTOLERANCE1)  Taught patients about crisis survival skills; when we need them and the goal of crisis survival skills. Patients were taught what these skills are NOT for, using the concept of STOP.  The TIP acronym was taught to reduce extreme emotion mind quickly.     The patient participated in group and/or other PHP services (as noted separately) during this time. Patient engaged in class discussion asking appropriate questions.  TIP worksheet was received by patient to be utilized for homework.     End Time: 1155    [name]

## 2018-05-08 ENCOUNTER — Encounter: Admit: 2018-05-08 | Discharge: 2018-05-13 | Payer: PRIVATE HEALTH INSURANCE

## 2018-05-08 DIAGNOSIS — F331 Major depressive disorder, recurrent, moderate: Secondary | ICD-10-CM

## 2018-05-08 MED ORDER — QUEtiapine (SEROQUEL) 300 MG tablet
300 | ORAL_TABLET | Freq: Every evening | ORAL | 0 refills | Status: AC
Start: 2018-05-08 — End: 2018-05-30

## 2018-05-08 NOTE — Unmapped (Signed)
Group Treatment Note      Patient Name: Tasha Buckley    Start Time: 1455    Wrap Up Group:     Group Description: This clinician assessed patients' physical and mental wellness via group discussion and review of daily check out document. Assessed patients' for presence of suicidal and/or homicidal ideations, plans or intentions. Assessed and discussed patients' safety plans and addressed patient concerns.     [name] completed check out sheet, denied SI/HI and verbalized safety plan. She did not need to speak to staff 1:1 and took all belongings and checked out of group space.        End Time: 1500

## 2018-05-08 NOTE — Unmapped (Signed)
Group Treatment Note      Patient Name: Tasha Buckley    Start Time: 1300    Group Name: Taboo    Group Objective: Pts. Practice criitical thinking, creativity coming up with strategies to get peers to guess the word quicker, and working against the timer.     The patient participated in group and/or other PHP services (as noted separately) during this time. Pt. Was engaged and cooperative in group. Pt. Asked questions and interacted with peers.       End Time: 1355    Ela Moffat

## 2018-05-08 NOTE — Unmapped (Signed)
Group Treatment Note      Patient Name: Tasha Buckley    Start Time: 1100    DBT Distress Tolerance 3 Pros and Cons/ Urge Management (.PHPDBTDISTRESSTOLERANCE3)  Taught patients??? the distress tolerance skill of accepting life as it is in the moment utilizing pros and cons and urge management techniques.  Demonstrated how to use the DBT pro and con four square and taught on urge management skills.  Gave patients a pro and con worksheet to complete in class.     The patient participated in group and/or other PHP services (as noted separately) during this time. Patient completed the Pro and Con Worksheet personalizing responses and speaking to urge management.  Patient shared responses with staff and group members.      End Time: 1155    [name]

## 2018-05-08 NOTE — Unmapped (Signed)
Group Treatment Note      Patient Name: Tasha Buckley    Start Time: 1000    DBT Distress Tolerance 2 ACCEPTS/ IMPROVES & 5 Senses (.PHPDBTDISTRESSTOLERANCE2)  Taught patients Distress Tolerance Skills of tolerating painful events and emotions utilizing distract with ACCEPTS, IMPROVE the moment and Self-Soothe methods via the 5 Senses.  Engaged patients??? in discussion of ways in which they have tolerated distress effectively in the past.     The patient participated in group and/or other PHP services (as noted separately) during this time. Patient shared their own self soothing techniques with the large group and listened attentively to other peers.  Patient selected a healthy self-soothing activity to use in helping tolerate distress and tolerance.    End Time: 1055    Benjamine Strout L. Lucretia Roers

## 2018-05-08 NOTE — Unmapped (Signed)
[facility]  Partial Hospitalization Program   Psychiatric Progress Note  ??  Name: Tasha Buckley  MRN: [id number]  Duration: [id number]  ??  Date of PHP admission: 05/01/2018    Today: Routine follow-up. She is liking PHP so far. She has found the skill of Check of Facts especially helpful. She feels that she can easily allow her emotions to consume her. Sleep has been disrupted. Previously Seroquel was helpful for sleep, but no longer. She's lying in bed with racing thoughts at night. She's not getting to sleep until about 2am most nights. She's been taking daytime naps to compensate for missed sleep. Reviewed sleep hygiene. No side effects from medications reported. She feels SI is slowly becoming more manageable. She appreciates the extra support and structure she's received in PHP. Whenever I enter one of these programs, things get much better. She reports she's had not any meltdowns since entering the program, but feels anger/irritability is underlying. Pt feels her ability to manage her symptoms without support of the program is impaired. Patient has been attending classes diligently and participating appropriately. Patient expressed her desire to return to OP LOC eventually, however voices hesitation in returning too early, before symptoms have gone into acceptable remission. We reviewed safety plan. Patient is denying SI. PHP has been very effective thus far; continuation in the program is recommended so as to further enhance patient's coping skills and ability to function and decrease risk of future decompensation which might lead to inpatient hospitalization. Patient is currently on day #6 of PHP, I am recommending 4 more days to ensure mood stability and symptom control.    05/01/18:??  Tasha Buckley is a 23 y.o. year old White or Caucasian female who presented to the [facility] Partial Hospital[facility] Program by way of her OPP c/o worsening anxiety/depression that  has led to self-harm. This patient has a history of MDD and r/o Bipolar 2.   ??  Today, the patient rates anxiety 4 out of 10, with 10 being the worst.  The patient rates depression 6 out of 10, with 10 being the worst. Further symptomatology is described below.   ??  When asked what the patient hopes to get out of PHP, the patient states, ???learn skills to deal with emotions.??? 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 She states she identifies with all 9 criteria. We discussed that DBT is the gold standard txt for this. She finds this diagnosis very relieving.   ??  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 She feels she has sensory issues, is very sensitive to certain sounds.  ??  ??  PSYCHIATRIC REVIEW OF SYMPTOMS:     ??  MOOD:   Patient denies: +depressed mood, +anhedonia, +change in appetite, +crying spells, +change in energy, psychomotor changes, +thoughts of death, +change in concentration, +worthlessness, +poor self-care/self-image, +guilt, hopelessness, +feeling overwhelmed, agitation, +poor frustration tolerance, mood lability, +irritability, impulsivity, euphoria, risky behaviors, grandiosity, hyperactivity, excessive spending/gambling, goal-directed behaviors, hyper-religious behaviors, +racing thoughts, excessive speech, mania or hypomania, +angry outbursts, +self-isolation, +poor motivation, vegetative symptoms  ??  ANXIETY:  Patient denies: +panic attacks, +restlessness, +generalized worry, palpitations, +feeling easily overwhelmed, hyperstartle response, hyperventilation, hyperarousal, flashbacks, paresthesias, agoraphobia, +intrusive thoughts, obsessive thoughts, paranoia, +social anxiety, compulsive behaviors/daily rituals,  purging/binging/restricting, self-mutilation, +avoidances,  preoccupation with somatic /unexplained illnesses, phobias or dissociation.   +Most recent panic attack was 1 week ago, she was watching a triggering movie, it was about suicide. She usually cries during her panic attacks.  ??  SLEEP:   Patient denies: insomnia, +prolonged sleep latency, nightmares, hypersomnolence, +multiple awakenings, early awakenings, decreased requirements for sleep, sleep apnea, parasomnia, recent sleep study, restless legs  ??  PSYCHOSIS:   Patient denies presently: AVH, delusions, preoccupations or paranoia, Catatonia, Negative symptoms  ??  COGNITION:   Patient denies: +Limited mental stamina, memory loss, decrease in ST memory, Poor task completion, often losing items, anomia (unable to recall the names of everyday objects), agnosia (inability to interpret sensations), apraxia (difficulty with the motor planning to perform tasks or movements when asked), feeling hyperfocused or +distractible, unproductive multi-tasking, inattentive to tasks that are disinteresting, Sundowners, disturbance in executive functions (planning, organizing, paying bills) - Becoming more problematic  ??  ??  Previous PSYCHIATRIC HISTORY:  ??  Previous inpatient admission:??Tasha Buckley was hospitalized once at [facility] from 08/29/16 to 08/31/16 for suicidal ideation and superficial cutting; the admission was triggered by being told by peers that she looked like she had lost weight but when she weighed herself she saw that she actually hadn't lost any weight.??She denies any hospitalizations since then. She sought care last fall from [facility] but her family ultimately decided not to pursue due to financial reasons.  ??  Previous history of violence to self or others:??Tasha Buckley reports a brief history of self-harm by cutting; she started cutting superficially 2 weeks prior to her inpatient admission in 2018.??She sometimes cuts now with the most recent episode on 11/01/17. She discussed passive suicidal ideation to drive her car  into a pole or overdose on medications but denies any intent. She denies any homicidal ideation or access to firearms.  ??  Protective factors/strengths:??Protective factors include her family, friends, and boyfriend. She discussed that after watching '13 Reasons Why' she thought of suicide as almost selfish, and the aftermath for the ones you care about is horrible. Strengths include her ability to listen to others and be compassionate.  ??  Past/Current Outpatient Treatment:??Tasha Buckley was seeing a Publishing rights manager at [facility] [place] [place] for medication management??as well as a therapist.??She currently sees Janace Litten for medication management and Dr. Manual Meier for therapy.  ??  Past Psychiatric Medication Trials:??During her inpatient admission in August of 2018 she was prescribed Wellbutrin XL 150 mg as an augmentation??to her Celexa but she stopped taking it.??She was prescribed Abilify in the past and found the side effects to be horrible including drooling, akathisia, and some difficulty breathing. She has noticed similar symptoms with the Seroquel (mainly akathisia) but has not found it to be as severe. She currently takes LTG and  Celexa. Melatonin causes weird dreams. OTC Benedryl not helpful either.  ??  Denies any previous course of PHP, ECT, TMS, or ketamine.  ??  Previous GeneSight testing: denies  ??  ??  ADDICTIONS & MALADAPTIVE BEHAVIORS:  ??  Patient denies the following maladaptive coping mechanisms:  Tobacco, ETOH, BZO, OPI, THC, stimulants, caffeine, energy drinks, social media, gambling, porn, sex, pyromania, shopping, trichotillomania, food or sugar, cutting, burning, skin-picking  ??  Endorses drinking 1 soda daily.  Tasha Buckley discussed that this was a pretty big issue in high school Denies regular alcohol use, but does report occ ETOH binges. Most recent last week. In the past she has required 10+ drinks due to her  high tolerance.  ??  Patient denies tolerance, withdrawal, guilt,  inability to stop, ongoing use despite consequences, Preoccupation with obtaining substance, escalating tolerance, lack of functioning, responsibilities, social or occupational functioning, Legal consequences  Patient denies h/o seizures, psychosis, withdrawal, cravings, blackouts related to drug use  ??  Denies current withdrawal symptoms: piloerection, tremor, diaphoresis, HA, nausea, muscle cramping, yawning, pupil dilation, diarrhea, rhinorrhea, hallucinations.  ??  IV Drug Abuse: denies  Previous Substance Abuse Treatment: denies    ??  SOCIAL DEVELOPMENTAL HISTORY                Per record review:                Lives with / Family:??Tasha Buckley recently moved back in with her parents, who are still married, in [place], [place]. She has one sister (age 51) with a history of OCD and nonverbal learning disorder who lives in [place]. She has one dog, cat, and rat. While she is sad to be leaving her friends behind in [place] she otherwise has not found the transition to be difficult and feels she made the right decision. Her parents have been very supportive through the transition as well.  ??  Development / Childhood:??Tasha Buckley grew up in [place] until age 72 and her family moved to [place]. When her father lost his job during her freshman year of college the family moved back to [place] where he was able to find employment again. Tasha Buckley denies any developmental delays or significant childhood stressors.  ??  Education:??Tasha Buckley was attending??the [employer] [place]. This year she started her junior year studying human development and family studies. In the spring she intends to start online school through [employer] and to switch her focus to substance abuse counseling. She attended high school in [place].  ??  Occupation:??Over the summer Tasha Buckley worked at [employer] and while in college she worked at [employer]. Pt now works for [employer] as a Designer, jewellery.    ??  Leisure:??In her downtime Tasha Buckley enjoys being with her friends, talking to her boyfriend, watching TV and movies (mostly crime dramas and documentaries), and would like to get into horseback riding.  ??  Relationships:??Tasha Buckley has been in a relationship with her boyfriend since the summer of 2019; they met each other working at [employer]. His family lives here and he studies Lawyer in [place] state. Tasha Buckley considers her college roommates to be her best friends.  ??  Spiritual:??Identifies as Ephriam Knuckles; in the past Tasha Buckley has been highly involved with the [employer] she was attending in [place].  ??  Legal:??Reports receiving a ticket for expired tags in 2018.  ??  Military:??Denies Financial planner.  ??  Sexuality:??Heterosexual and identifies with birth gender.  ??  Trauma:??Tasha Buckley denies any history of sexual, physical, or emotional abuse. She denies any significant losses.  ??  ??  FAMILY MEDICAL AND PSYCHIATRIC HISTORY:   ??        Family History   Problem Relation Age of Onset   ??? Alcohol abuse Mother ??   ??? Bipolar disorder Mother ??   ??? Depression Mother ??   ??? Anxiety disorder Sister ??   ??? OCD Sister ??   ??? Learning disabilities Sister ??   ??  Medication trials for family:  denies knowledge of any successful trials  ??  ??  Medical ROS:  The patient denies weakness, fever, chills, unintentional  weight loss, night sweats, heat/cold intolerance, recent travel, recent injury or trauma. Denies change in appetite, +sleep, bowel or bladder habits.  CARDIOVASCULAR: The patient denies chest pain, irregular heartbeats, palpitation, shortness of breath, dyspnea, edema, difficulty breathing at night, cramps in legs with walking.  RESPIRATORY: The patient denies cough, sputum, hemoptysis, dyspnea, wheezing  GASTROINTESTINAL: The patient denies nausea, vomiting, difficulty in chewing or swallowing, indigestion, poor appetite, constipation, diarrhea, or abdominal pain. States regular bowel  movements  MUSCULOSKELETAL: The patient denies arm, buttock, thigh or calf cramps. Denies numbness/tingling. No muscle weakness or tenderness. No joint swelling.  SKIN: The patient denies easy bruising, skin redness, skin rash, lumps, sores, changes in skin color.  NEUROLOGIC: CN II - XII intact. The patient denies headache, dizziness, syncope, loss of consciousness, vertigo, nausea, emesis, tinnitus, parathesias. Patient denies changes in eyesight, speech, swallowing, or hearing. Denies change in memory, gait, or coordination. Denies neuropathy, recent trauma, injury or aphasia. Denies change in orientation or muscle spasms.  OTHER SYSTEMS: Reviewed and are negative  Social history and medical problems have been reviewed.  ??  ??  PHYSICAL EXAM:  Constitutional: Level of Distress: NAD. Ambulation: ambulating normally.  Head/Face: atraumatic.  Musculoskeletal: Joints, Bones, and Muscles: no contractures, malalignment, tenderness, or bony abnormalities and normal movement of all extremities. Extremities: no cyanosis, edema, varicosities, or palpable cord.  Neurologic: Gait and Station: normal gait and station. Cranial Nerves: grossly intact. Sensation: grossly intact. Coordination and Cerebellum: no tremor.  Skin: Inspection and palpation: no rash, lesions, ulcer, induration, jaundice, or piloerection and good turgor. Nails: normal.  ??  ??  PAST MEDICAL HISTORY       Past Medical History:   Diagnosis Date   ??? Anxiety ??   ??? Concussion ??   ?? mild, age 38-18, fell on treadmill at gym   ??? Depression ??   ??? Mood swings ??   ??  ??  ALLERGIES  No Known Allergies  ??  ??  SURGICAL HISTORY:  No past surgical history on file.  ??  ??  PCP and other specialists:   Attending Provider Unknown  ??  ??  VITAL SIGNS:  ??      Vitals:   ?? 05/01/18 0815   BP: 142/84   Pulse: 96   Resp: 16   Temp: 98 ??F (36.7 ??C)   SpO2: 99%   ??  ??  Patient reports pain to be: 0 out of 10, with 10 being the worst pain.  ??  Body mass index is 35.51  kg/m??.  ??  ??  ECG 12 lead   No results found.  ??  ??  Laboratory results from Oct 2019 stay reviewed.   ??  ??  CURRENT MEDICATIONS       Current Outpatient Medications   Medication Sig   ??? citalopram Take 1 tablet (40 mg total) by mouth daily.   ??? lamoTRIgine Take 2 tablets (50 mg total) by mouth daily. After 2 weeks take 3 tablets (75 mg total)   ??? QUEtiapine Take 2 tablets (200 mg total) by mouth at bedtime.   ??  No current facility-administered medications for this visit.    ??  ??  ??  MENTAL STATUS EXAMINATION:  ??  Location of interview: in office  APPEARANCE: well-groomed / appropriate hair, makeup, jewelry, clothing / eyeglasses in place / tattoos noted to LFA  WEIGHT: overweight body habitus    GAIT: normal gait and station  PSM activity: normal & calm  WAKEFULNESS: alert   ORIENTATION: Patient oriented to person, place, time, situation, president.  Eye contact: good  SPEECH/LANGUAGE: normal pace, volume, rhythm   MOOD: anxious  AFFECT: mood congruent  STREAM of mental activity/LANGUAGE: coherent / logical / relevant / linear / Reaction time to questions WNL / fair concentration  THOUGHT CONTENT: no overt psychosis, AVH, delusional content   ATTITUDE TO EXAMINER: interested, attentive, friendly, cooperative   IDEATIONS: denies active SI or HI / able to discuss/agree to safety plan / CFS / RFL / future-oriented  MEMORY: recent and remote intact   COMPREHENSION/UNDERSTANDING: adequate   FUND OF KNOWLEDGE: normal  JUDGEMENT: intact   INSIGHT: needs improvement  RELIABILITY: good historian  ??  ??  DIAGNOSIS/IMPRESSION:  ??  MDD, recurrent, moderate  Cluster B PD  GAD  SIB, last episode 1 week ago  H/o ETOH use, binge pattern  ??  ??  Assets/Strengths/Protective Factors:  employment history, self-expressions, sense of hope, sense of humor and supportive network  ??  Weaknesses/Limitations/Barriers to Treatment:  lacks insight and mental illness  ??  Attitudes and Behaviors that require change:  Affective instability/mood  dysregulation  Self-harming impulses/behaviors  Suicidal ideation  Maladaptive coping strategies  ??  ??  Management PLAN:   ??  Continue in PHP for an estimated 10 day stay.   ??  Monitor symptoms and side effects and adjust medication doses accordingly  ??  Monitor safety and adjust level of care to least restrictive level as clinically indicated  ??  Medication plans will include:  INCREASE Seroquel to 300mg  hs.   Increased LTG to 100mg  daily 1 week ago.  Continue Celexa 40mg  daily  ??  Follow up in office with:   Janace Litten for medication management and Dr. Manual Meier for therapy.  ??  Estimated day of Discharge from PHP: 05/14/18  ??  Most recent physical with PCP reported by pt to be October 2019.   ??  RTW plan: n/a  ??  She reports she is living at parents' home, all sharp objects have been locked away. She reports her medications are locked away also, and her mother is dosing pt nightly.   ??  Laveda Norman, MSN, APRN, PMHNP-BC  Psychiatric Nurse Practitioner  Board Certified in Internal Medicine, Psychiatry/Mental Health, and Addictions  ??  -  ??  ??  Treatment goals include: Establish safe behaviors, engage in safety plan, Stabilize mood symptoms, Improve ability to meet basic needs, Reduce psychosis, Improve medication and treatment adherence, Provide psychoeducation, Supportive care for substance withdrawal, Improve coping, Reduce anxiety symptoms    All side effects, risks, benefits, and alternatives discussed; patient states understanding and agreement with current course of treatment. Treatment plan was reviewed with the patient who is in agreement. Patient was educated on appropriate expectations as to the timing and efficacy of psychotropic medications (at least 4 weeks with antidepressants, etc.)  ??  Decision making complexity: moderate to high. Components considered and discussed: patient safety, differential diagnosis, previous medication trials, risks of medication complications, risk of symptom relapse, and  medication side effects.   ??

## 2018-05-08 NOTE — Unmapped (Signed)
Group Treatment Note      Patient Name: Tasha Buckley    Start Time: 1400    W[facility] Self Sabotage (.PHPSELFSABOTAGE)  Group leader educated patients about the meaning of sabotage, the goal of sabotage, and how it can negatively impact the process of regaining wellness. In small groups; the patients were asked to share ways in which they have sabotaged themselves or others, and if this was a conscious or unconscious effort.  As a large group; strategies of recognizing signs and triggers of self-sabotage were discussed.      The patient participated in group and/or other PHP services (as noted separately) during this time.  Patient was able to express strategies for recognizing and resisting the signs and triggers of self-sabotage.     End Time: 1455    Joenathan Sakuma L. Lucretia Roers

## 2018-05-08 NOTE — Progress Notes (Signed)
Pt did attend the 0900 community meeting group from [phone].  Pt then met with CNP from [phone].  Pt did not return to group.  Pt was active and engaged in discussion when present in the group.

## 2018-05-09 NOTE — Unmapped (Addendum)
Tasha Buckley was a no call no show for PHP today.  This nurse called her cell two times and left voice messages with nursing direct phone number.  Called her mother and left voice message as well with call back number. Tasha Buckley called back at 1230 and spoke with this nurse.  I overslept and didn't hear my alarms.  I took my first increase in Seroquel last night at 11pm and didn't fall asleep until midnight.  Encouraged her to take it and go to sleep earlier and report any issue on Monday when she returns.

## 2018-05-12 ENCOUNTER — Encounter: Admit: 2018-05-12 | Payer: PRIVATE HEALTH INSURANCE

## 2018-05-12 DIAGNOSIS — F331 Major depressive disorder, recurrent, moderate: Secondary | ICD-10-CM

## 2018-05-12 NOTE — Unmapped (Signed)
Group Treatment Note      Patient Name: Shirl Weir    Start Time: 1300  ??  Enrichment Group: Compassion and Self-Compassion  ??  Group Objective: Patients will receive definitions of compassion and self-compassion; patients will explore the relationship between compassion and self-compassion; patients will learn about the [employer] Meditation as a means of cultivating compassion and self-compassion; patients will be led in a guided version of the [employer] and will share reflections on their experience.   ??  Participation: Patient listened attentively and participated in group discussion. Patient received and reviewed handout. The patient participated in group and/or other PHP services (as noted separately) during this time.  ??  End Time: 1355    Yasmene Salomone

## 2018-05-12 NOTE — Unmapped (Signed)
Group Treatment Note      Patient Name: Tasha Buckley    Start Time: 1000    W[facility] Goal Setting (.PHPWSMGOALSETTING)  Group leader educated patients on topic of goal setting. Leader discussed what life goals are and why are they important. Leader educated patients on topic of understanding barriers that get in the way of goal achievement and common barriers. Leader engaged patients in an activity to reinforce concepts and stimulate discussion. Leader encouraged patients to choose an action step to reducing the barrier they identified.    The patient participated in group and/or other PHP services (as noted separately) during this time. Patient shared with the group what a positive quality of life means to them.  Patient verbalized understanding of group content regarding goals and barriers. Patient was able to select a personal goal and barrier, as well as, an action step to take to reduce selected barrier.  Patient shared these selections with the group.    End Time: 1055    Samantha Ragen L. Lucretia Roers

## 2018-05-12 NOTE — Unmapped (Signed)
Group Treatment Note      Patient Name: Tasha Buckley    Start Time: 0900    Community Group (.PHPCOMMUNITYGROUP)  This clinician assessed patients??? physical and mental wellness via group discussion and review of daily check in document. Clinician also assessed patients' for presence of suicidal and /or homicidal ideation, plans or intentions and addressed patient concerns.    The patient participated in group and/or other PHP services (as noted separately) during this time. Patient completed check in sheet, denied SI/HI. Patient verbalized positive action taken in the past 24 hours and shared how time was spent.      Pt reports having a good weekend.  Pt reports she spent time doing an [employer] with her family on Sunday, watched some movies, and went on an adventure with her father to a park on Saturday.  Pt reports she struggled at times because my sister is a really good singer and she got a lot of attention with my family on Easter Sunday.  I don't have anything like that, as far as a talent or something I'm really good at, so it's really hard sometimes.  Pt reports she slept well throughout the weekend.  Pt currently denies any struggles or issues to address with the treatment team.  Pt remains invested in treatment.      End Time: 0955    [name]

## 2018-05-12 NOTE — Unmapped (Signed)
Group Treatment Note      Patient Name: Tasha Buckley    Start Time: 1455  Wrap Up Group:     Group Description: This clinician assessed patients' physical and mental wellness via group discussion and review of daily check out document. Assessed patients' for presence of suicidal and/or homicidal ideations, plans or intentions. Assessed and discussed patients' safety plans and addressed patient concerns.    Group Objective: Assess patients' mental and physical wellness. Assess patients for presence of suicidal or homicidal thoughts. Assessed patients' access to and ability to follow safety plan.     Ceasar Lund completed check out sheet, denied SI/HI and verbalized safety plan. She did not need to speak to staff 1:1 and took all belongings and checked out of group space.           End Time: 1500

## 2018-05-12 NOTE — Unmapped (Signed)
Group Treatment Note      Patient Name: Tasha Buckley    Start Time: 1100    Approval Dependency  Discussed thoughts and beliefs that foster an unhealthy need for approval.  Asked patients to share thoughts they may have about themselves and others when they are seeking approval from a specific individual or a group of people.  Asked patients to share ideas how to challenge those approval seeking thoughts.  Discussed the thoughts mentioned on the worksheet provided regarding cognitive strategies for countering these beliefs.    Pt fully participated in programming.  Pt engaged in group discussion, asking appropriate questions and providing positive feedback to peers.      The patient participated in group and/or other PHP services (as noted separately) during this time.      End Time: 1155    [name]

## 2018-05-12 NOTE — Unmapped (Signed)
Group Treatment Note      Patient Name: Rena Hunke    Start Time: 1400    WSM Assertiveness (.PHPWSMASSERTIVENESS)  Group leader educated patients about the concept of assertiveness including, myths about assertive behavior, effects of being un-assertive in life, and why we may become un-assertive. Group leader began a discussion of what stops [place] from being assertive in different life situations. Leader engaged patients in self-reflection exercise ???how assertive are you???? then discussed results as a group.    The patient participated in group and/or other PHP services (as noted separately) during this time.  Patient completed self-reflection exercise and shared with the group.     End Time: 1455    Vinia Jemmott L. Lucretia Roers

## 2018-05-13 ENCOUNTER — Encounter: Admit: 2018-05-13 | Payer: PRIVATE HEALTH INSURANCE

## 2018-05-13 DIAGNOSIS — F331 Major depressive disorder, recurrent, moderate: Secondary | ICD-10-CM

## 2018-05-13 NOTE — Unmapped (Signed)
Called patient and left voice mail regarding scheduling outpatient follow-up with her [facility] providers, Dr. Manual Meier and Janace Litten, NP.

## 2018-05-13 NOTE — Unmapped (Signed)
Tasha Buckley came to this nurse and complained of bad headache, I just want to go home.  She filled out check out sheet and stated I will be back tomorrow

## 2018-05-13 NOTE — Unmapped (Signed)
Group Treatment Note      Patient Name: Tasha Buckley    Start Time: 1000    DBT Interpersonal Effectiveness 1 Situations and Goals (.PHPDBTINTERPERSONALEFFECTIVENESS1)  Introduced the concept of Interpersonal Effectiveness to patients, utilizing the areas of objective effectiveness, relationship effectiveness and self-respect effectiveness.  Taught patients about situations for interpersonal effectiveness which include balancing priorities, building mastery and self-respect. Brainstormed with patients??? regarding what difficulties they experience with interpersonal effectiveness in their daily lives.     The patient participated in group and/or other PHP services (as noted separately) during this time. Pt reviewed 3 main goals Interpersonal Effectiveness worksheet with staff.    End Time: 1055    Pierre Dellarocco L. Lucretia Roers

## 2018-05-13 NOTE — Unmapped (Signed)
Group Treatment Note      Patient Name: Arrayah Connors    Start Time: 0900    Community Group (.PHPCOMMUNITYGROUP)  This clinician assessed patients??? physical and mental wellness via group discussion and review of daily check in document. Clinician also assessed patients' for presence of suicidal and /or homicidal ideation, plans or intentions and addressed patient concerns.    The patient participated in group and/or other PHP services (as noted separately) during this time. Patient completed check in sheet, denied SI/HI. Patient verbalized positive action taken in the past 24 hours and shared how time was spent.      Pt reports having a rough evening.  Pt states she went to [employer] with her sisters to do some shopping.  Pt states she became angry with her sister in the car and had a meltdown.  Pt states she went in to [employer] to shop alone and her sister's called her parents.  Pt reports she spoke to her parents on the phone and told them she was walking home.  Pt states her parents approved of her walking home, but when parents attempted to pick her up on the side of the road pt snapped at her parents no, you told me to walk home.  Pt reports she and her mother don't think my medication is working properly.  Pt reports she slept well because I was exhausted after what all took place last night.  Pt currently denies any struggles or issues to address with the treatment team.  Pt remains invested in treatment.      End Time: 0955    [name]

## 2018-05-13 NOTE — Unmapped (Signed)
Group Treatment Note      Patient Name: Tasha Buckley    Start Time: 1100    DBT Interpersonal Effectiveness 2 DEARMAN (.PHPINTERPERSONALEFFECTIVENESS2)  Taught patients??? Interpersonal Effectiveness skills for objective effectiveness and getting what you want, utilizing the DEAR MAN technique. Engaged patients in practice of asking for what they want or refusing a request using DEAR MAN skill and worksheet    The patient participated in group and/or other PHP services (as noted separately) during this time.  Patient completed [facility] worksheet and shared responses with staff and group members.     End Time: 1155    [name]

## 2018-05-14 ENCOUNTER — Encounter: Admit: 2018-05-14 | Discharge: 2018-05-19 | Payer: PRIVATE HEALTH INSURANCE

## 2018-05-14 DIAGNOSIS — F331 Major depressive disorder, recurrent, moderate: Secondary | ICD-10-CM

## 2018-05-14 MED ORDER — lamoTRIgine (LAMICTAL) 100 MG tablet
100 | ORAL_TABLET | Freq: Every day | ORAL | 0 refills | Status: AC
Start: 2018-05-14 — End: 2018-05-30

## 2018-05-14 NOTE — Unmapped (Signed)
Pt did attend the 0900 community meeting group from [phone].  Pt then met with CNP from [phone].  Pt did not return to group.  Pt was active and engaged in discussion when present in the group.

## 2018-05-14 NOTE — Unmapped (Signed)
[facility]  Partial Hospitalization Program   Psychiatric Progress Note  ??  Name: Tasha Buckley  MRN: [id number]  Duration: [id number]  ??  Date of PHP admission: 05/01/2018    Today: Met with patient for purposes of discharge today. However, patient is reporting her discomfort with discharging today. She feels as though her ability to manage her symptoms without support of the program is impaired. She reports that on Saturday night she had a meltdown and became upset with her older sister - pt reports her sister is a Public librarian - the sister began singing on their front porch, and it upset me because I don't have any talents like that. Pt reports she became very angry and agitated, yelling and hyperventilating. Pt reports on Monday night, she was in the care with her younger sister who is learning to drive - sister took a sharp turn, pt felt unsafe and started screaming at her. Pt admits she was so rude to her sister. Sister told her to sit in backseat on the way home so that she would not be distracted while driving. Pt didn't want to sit in the backseat so she began walking home. Parents tried to pick her up in their truck, but pt refused and walked the whole way home. Once she got home, she began lying on the floor crying, screaming and hyperventilating.  I know I was being irrational, but in the moment I can't comprehend that. Pt admits she did not use her DBT skills in that moment and chose instead to react in an uncontrollable manner. Staff reviewed with patient the TIPS skill that she could have used in this moment of feeling overwhelmed. Pt reports she started her menses on Sunday and she wonders if there is a hormonal component to her meltdowns. Denies active SI today, but obvious emotional dysregulation continues with this patient. Sleep is disrupted due to poor sleep hygiene. Stays up late, naps often in the afternoon/evening. Appetite up a bit since the  increase in Seroquel. Discussed option of extending in PHP for another 5 days. She is on day #9 and was approved for 10 days with her insurance company. Pt elects to extend another week. We reviewed safety plan. PHP has very effective thus far; continuation in the program is recommended so as to further enhance patient's coping skills and ability to function and decrease risk of future decompensation which might lead to inpatient hospitalization. Patient is currently on day #10 of PHP, I am recommending an extra 5 days to ensure mood stability and symptom control.    05/08/18: Routine follow-up. She is liking PHP so far. She has found the skill of Check of Facts especially helpful. She feels that she can easily allow her emotions to consume her. Sleep has been disrupted. Previously Seroquel was helpful for sleep, but no longer. She's lying in bed with racing thoughts at night. She's not getting to sleep until about 2am most nights. She's been taking daytime naps to compensate for missed sleep. Reviewed sleep hygiene. No side effects from medications reported. She feels SI is slowly becoming more manageable. She appreciates the extra support and structure she's received in PHP. Whenever I enter one of these programs, things get much better. She reports she's had not any meltdowns since entering the program, but feels anger/irritability is underlying. Pt feels her ability to manage her symptoms without support of the program is impaired. Patient has been attending classes diligently and participating appropriately.  Patient expressed her desire to return to OP LOC eventually, however voices hesitation in returning too early, before symptoms have gone into acceptable remission. We reviewed safety plan. Patient is denying SI. PHP has been very effective thus far; continuation in the program is recommended so as to further enhance patient's coping skills and ability to function and decrease risk of future decompensation  which might lead to inpatient hospitalization. Patient is currently on day #6 of PHP, I am recommending 4 more days to ensure mood stability and symptom control.    05/01/18:??  Tasha Buckley is a 23 y.o. year old White or Caucasian female who presented to the [facility] Partial Hospitalization Program by way of her OPP c/o worsening anxiety/depression that has led to self-harm. This patient has a history of MDD and r/o Bipolar 2.   ??  Today, the patient rates anxiety 4 out of 10, with 10 being the worst.  The patient rates depression 6 out of 10, with 10 being the worst. Further symptomatology is described below.   ??  When asked what the patient hopes to get out of PHP, the patient states, ???learn skills to deal with emotions.??? 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 She states she identifies with all 9 criteria. We discussed that DBT is the gold standard txt for this. She finds this diagnosis very relieving.   ??  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 She feels she has sensory issues, is very sensitive to certain sounds.  ??  ??  PSYCHIATRIC REVIEW OF SYMPTOMS:     ??  MOOD:   Patient denies: +depressed mood, +anhedonia, +change in appetite, +crying spells, +change in energy, psychomotor changes, +thoughts of death, +change in concentration, +worthlessness, +poor self-care/self-image, +guilt, hopelessness, +feeling overwhelmed, agitation, +poor frustration tolerance, mood lability, +irritability, impulsivity, euphoria, risky behaviors, grandiosity, hyperactivity, excessive spending/gambling, goal-directed behaviors, hyper-religious behaviors, +racing thoughts, excessive speech, mania or hypomania, +angry outbursts, +self-isolation, +poor motivation, vegetative  symptoms  ??  ANXIETY:  Patient denies: +panic attacks, +restlessness, +generalized worry, palpitations, +feeling easily overwhelmed, hyperstartle response, hyperventilation, hyperarousal, flashbacks, paresthesias, agoraphobia, +intrusive thoughts, obsessive thoughts, paranoia, +social anxiety, compulsive behaviors/daily rituals, purging/binging/restricting, self-mutilation, +avoidances, preoccupation with somatic /unexplained illnesses, phobias or dissociation.   +Most recent panic attack was 1 week ago, she was watching a triggering movie, it was about suicide. She usually cries during her panic attacks.  ??  SLEEP:   Patient denies: insomnia, +prolonged sleep latency, nightmares, hypersomnolence, +multiple awakenings, early awakenings, decreased requirements for sleep, sleep apnea, parasomnia, recent sleep study, restless legs  ??  PSYCHOSIS:   Patient denies presently: AVH, delusions, preoccupations or paranoia, Catatonia, Negative symptoms  ??  COGNITION:   Patient denies: +Limited mental stamina, memory loss, decrease in ST memory, Poor task completion, often losing items, anomia (unable to recall the names of everyday objects), agnosia (inability to interpret sensations), apraxia (difficulty with the motor planning to perform tasks or movements when asked), feeling hyperfocused or +distractible, unproductive multi-tasking, inattentive to tasks that are disinteresting, Sundowners, disturbance in executive functions (planning, organizing, paying bills) - Becoming more problematic  ??  ??  Previous PSYCHIATRIC HISTORY:  ??  Previous inpatient admission:??[name] was hospitalized once at [facility] from 08/29/16 to 08/31/16 for suicidal ideation and superficial cutting; the  admission was triggered by being told by peers that she looked like she had lost weight but when she weighed herself she saw that she actually hadn't lost any weight.??She denies any hospitalizations since then. She sought care last fall from [facility]  but her family ultimately decided not to pursue due to financial reasons.  ??  Previous history of violence to self or others:??[name] reports a brief history of self-harm by cutting; she started cutting superficially 2 weeks prior to her inpatient admission in 2018.??She sometimes cuts now with the most recent episode on 11/01/17. She discussed passive suicidal ideation to drive her car into a pole or overdose on medications but denies any intent. She denies any homicidal ideation or access to firearms.  ??  Protective factors/strengths:??Protective factors include her family, friends, and boyfriend. She discussed that after watching '13 Reasons Why' she thought of suicide as almost selfish, and the aftermath for the ones you care about is horrible. Strengths include her ability to listen to others and be compassionate.  ??  Past/Current Outpatient Treatment:??[name] was seeing a Publishing rights manager at [facility] [place] [place] for medication management??as well as a therapist.??She currently sees Janace Litten for medication management and Dr. Manual Meier for therapy.  ??  Past Psychiatric Medication Trials:??During her inpatient admission in August of 2018 she was prescribed Wellbutrin XL 150 mg as an augmentation??to her Celexa but she stopped taking it.??She was prescribed Abilify in the past and found the side effects to be horrible including drooling, akathisia, and some difficulty breathing. She has noticed similar symptoms with the Seroquel (mainly akathisia) but has not found it to be as severe. She currently takes LTG and  Celexa. Melatonin causes weird dreams. OTC Benedryl not helpful either.  ??  Denies any previous course of PHP, ECT, TMS, or ketamine.  ??  Previous GeneSight testing: denies  ??  ??  ADDICTIONS & MALADAPTIVE BEHAVIORS:  ??  Patient denies the following maladaptive coping mechanisms:  Tobacco, ETOH, BZO, OPI, THC, stimulants, caffeine, energy drinks, social media, gambling, porn, sex,  pyromania, shopping, trichotillomania, food or sugar, cutting, burning, skin-picking  ??  Endorses drinking 1 soda daily.  [name] discussed that this was a pretty big issue in high school Denies regular alcohol use, but does report occ ETOH binges. Most recent last week. In the past she has required 10+ drinks due to her high tolerance.  ??  Patient denies tolerance, withdrawal, guilt, inability to stop, ongoing use despite consequences, Preoccupation with obtaining substance, escalating tolerance, lack of functioning, responsibilities, social or occupational functioning, Legal consequences  Patient denies h/o seizures, psychosis, withdrawal, cravings, blackouts related to drug use  ??  Denies current withdrawal symptoms: piloerection, tremor, diaphoresis, HA, nausea, muscle cramping, yawning, pupil dilation, diarrhea, rhinorrhea, hallucinations.  ??  IV Drug Abuse: denies  Previous Substance Abuse Treatment: denies    ??  SOCIAL DEVELOPMENTAL HISTORY                Per record review:                Lives with / Family:??[name] recently moved back in with her parents, who are still married, in [place], [place]. She has one sister (age 36) with a history of OCD and nonverbal learning disorder who lives in [place]. She has one dog, cat, and rat. While she is sad to be leaving her friends behind in [place] she otherwise has not found the transition to be difficult and  feels she made the right decision. Her parents have been very supportive through the transition as well.  ??  Development / Childhood:??[name] grew up in [place] until age 27 and her family moved to [place]. When her father lost his job during her freshman year of college the family moved back to [place] where he was able to find employment again. [name] denies any developmental delays or significant childhood stressors.  ??  Education:??[name] was attending??[employer] [place], [place]. This year she started her junior year  studying human development and family studies. In the spring she intends to start online school through [employer] and to switch her focus to substance abuse counseling. She attended high school in [place].  ??  Occupation:??Over the summer [name] worked at [employer] and while in college she worked at [employer]. Pt now works for [employer] as a Designer, jewellery.   ??  Leisure:??In her downtime [name] enjoys being with her friends, talking to her boyfriend, watching TV and movies (mostly crime dramas and documentaries), and would like to get into horseback riding.  ??  Relationships:??[name] has been in a relationship with her boyfriend since the summer of 2019; they met each other working at [employer]. His family lives here and he studies Lawyer in [place] state. [name] considers her college roommates to be her best friends.  ??  Spiritual:??Identifies as Ephriam Knuckles; in the past [name] has been highly involved with the [employer] she was attending in [place].  ??  Legal:??Reports receiving a ticket for expired tags in 2018.  ??  Military:??Denies Financial planner.  ??  Sexuality:??Heterosexual and identifies with birth gender.  ??  Trauma:??[name] denies any history of sexual, physical, or emotional abuse. She denies any significant losses.  ??  ??  FAMILY MEDICAL AND PSYCHIATRIC HISTORY:   ??        Family History   Problem Relation Age of Onset   ??? Alcohol abuse Mother ??   ??? Bipolar disorder Mother ??   ??? Depression Mother ??   ??? Anxiety disorder Sister ??   ??? OCD Sister ??   ??? Learning disabilities Sister ??   ??  Medication trials for family:  denies knowledge of any successful trials  ??  ??  Medical ROS:  The patient denies weakness, fever, chills, unintentional weight loss, night sweats, heat/cold intolerance, recent travel, recent injury or trauma. Denies change in appetite, +sleep, bowel or bladder habits.  CARDIOVASCULAR: The patient denies chest pain, irregular  heartbeats, palpitation, shortness of breath, dyspnea, edema, difficulty breathing at night, cramps in legs with walking.  RESPIRATORY: The patient denies cough, sputum, hemoptysis, dyspnea, wheezing  GASTROINTESTINAL: The patient denies nausea, vomiting, difficulty in chewing or swallowing, indigestion, poor appetite, constipation, diarrhea, or abdominal pain. States regular bowel movements  MUSCULOSKELETAL: The patient denies arm, buttock, thigh or calf cramps. Denies numbness/tingling. No muscle weakness or tenderness. No joint swelling.  SKIN: The patient denies easy bruising, skin redness, skin rash, lumps, sores, changes in skin color.  NEUROLOGIC: CN II - XII intact. The patient denies headache, dizziness, syncope, loss of consciousness, vertigo, nausea, emesis, tinnitus, parathesias. Patient denies changes in eyesight, speech, swallowing, or hearing. Denies change in memory, gait, or coordination. Denies neuropathy, recent trauma, injury or aphasia. Denies change in orientation or muscle spasms.  OTHER SYSTEMS: Reviewed and are negative  Social history and medical problems have been reviewed.  ??  ??  PHYSICAL  EXAM:  Constitutional: Level of Distress: NAD. Ambulation: ambulating normally.  Head/Face: atraumatic.  Musculoskeletal: Joints, Bones, and Muscles: no contractures, malalignment, tenderness, or bony abnormalities and normal movement of all extremities. Extremities: no cyanosis, edema, varicosities, or palpable cord.  Neurologic: Gait and Station: normal gait and station. Cranial Nerves: grossly intact. Sensation: grossly intact. Coordination and Cerebellum: no tremor.  Skin: Inspection and palpation: no rash, lesions, ulcer, induration, jaundice, or piloerection and good turgor. Nails: normal.  ??  ??  PAST MEDICAL HISTORY       Past Medical History:   Diagnosis Date   ??? Anxiety ??   ??? Concussion ??   ?? mild, age 30-18, fell on treadmill at gym   ??? Depression ??   ??? Mood swings ??   ??  ??  ALLERGIES  No  Known Allergies  ??  ??  SURGICAL HISTORY:  No past surgical history on file.  ??  ??  PCP and other specialists:   Attending Provider Unknown  ??  ??  VITAL SIGNS:  ??      Vitals:   ?? 05/01/18 0815   BP: 142/84   Pulse: 96   Resp: 16   Temp: 98 ??F (36.7 ??C)   SpO2: 99%   ??  ??  Patient reports pain to be: 0 out of 10, with 10 being the worst pain.  ??  Body mass index is 35.51 kg/m??.  ??  ??  ECG 12 lead   No results found.  ??  ??  Laboratory results from Oct 2019 stay reviewed.   ??  ??  CURRENT MEDICATIONS       Current Outpatient Medications   Medication Sig   ??? citalopram Take 1 tablet (40 mg total) by mouth daily.   ??? lamoTRIgine Take 2 tablets (50 mg total) by mouth daily. After 2 weeks take 3 tablets (75 mg total)   ??? QUEtiapine Take 2 tablets (200 mg total) by mouth at bedtime.   ??  No current facility-administered medications for this visit.    ??  ??  ??  MENTAL STATUS EXAMINATION:  ??  Location of interview: in office  APPEARANCE: well-groomed / appropriate hair, makeup, jewelry, clothing / eyeglasses in place / tattoos noted to LFA  WEIGHT: overweight body habitus    GAIT: normal gait and station  PSM activity: normal & calm  WAKEFULNESS: alert   ORIENTATION: Patient oriented to person, place, time, situation, president.  Eye contact: good  SPEECH/LANGUAGE: normal pace, volume, rhythm   MOOD: anxious  AFFECT: mood congruent  STREAM of mental activity/LANGUAGE: coherent / logical / relevant / linear / Reaction time to questions WNL / fair concentration  THOUGHT CONTENT: no overt psychosis, AVH, delusional content   ATTITUDE TO EXAMINER: interested, attentive, friendly, cooperative   IDEATIONS: denies active SI or HI / able to discuss/agree to safety plan / CFS / RFL / future-oriented  MEMORY: recent and remote intact   COMPREHENSION/UNDERSTANDING: adequate   FUND OF KNOWLEDGE: normal  JUDGEMENT: intact   INSIGHT: needs improvement  RELIABILITY: good historian  ??  ??  DIAGNOSIS/IMPRESSION:  ??  MDD, recurrent,  moderate  Cluster B PD  GAD  SIB, last episode 1 week ago  H/o ETOH use, binge pattern  ??  ??  Assets/Strengths/Protective Factors:  employment history, self-expressions, sense of hope, sense of humor and supportive network  ??  Weaknesses/Limitations/Barriers to Treatment:  lacks insight and mental illness  ??  Attitudes and Behaviors that require  change:  Affective instability/mood dysregulation  Self-harming impulses/behaviors  Suicidal ideation  Maladaptive coping strategies  ??  ??  Management PLAN:   ??  Continue in PHP for an estimated 15 day stay.   ??  Monitor symptoms and side effects and adjust medication doses accordingly  ??  Monitor safety and adjust level of care to least restrictive level as clinically indicated  ??  Medication plans will include:  Continue Seroquel 300mg  hs (just increased last week)  INCREASE LTG to 150mg   Continue Celexa 40mg  daily  ??  Follow up in office with:   Janace Litten for medication management and Dr. Manual Meier for therapy.  ??  Estimated day of Discharge from PHP: 05/22/18  ??  Most recent physical with PCP reported by pt to be October 2019.   ??  RTW plan: n/a  ??  She reports she is living at parents' home, all sharp objects have been locked away. She reports her medications are locked away also, and her mother is dosing pt nightly.   ??  Laveda Norman, MSN, APRN, PMHNP-BC  Psychiatric Nurse Practitioner  Board Certified in Internal Medicine, Psychiatry/Mental Health, and Addictions  ??  -  ??  ??  Treatment goals include: Establish safe behaviors, engage in safety plan, Stabilize mood symptoms, Improve ability to meet basic needs, Reduce psychosis, Improve medication and treatment adherence, Provide psychoeducation, Supportive care for substance withdrawal, Improve coping, Reduce anxiety symptoms    All side effects, risks, benefits, and alternatives discussed; patient states understanding and agreement with current course of treatment. Treatment plan was reviewed with the patient who is  in agreement. Patient was educated on appropriate expectations as to the timing and efficacy of psychotropic medications (at least 4 weeks with antidepressants, etc.)  ??  Decision making complexity: moderate to high. Components considered and discussed: patient safety, differential diagnosis, previous medication trials, risks of medication complications, risk of symptom relapse, and medication side effects.   ??

## 2018-05-14 NOTE — Unmapped (Signed)
Group Treatment Note      Patient Name: Tasha Buckley    Start Time: 1000    DBT Interpersonal Effectiveness 3 GIVE & FAST (.PHPDBTINTERPERSONALEFFECTIVENESS3)  Taught patients??? Interpersonal Effectiveness skills for relationship effectiveness and keeping the relationship, using the GIVE technique.  Taught patients interpersonal effectiveness skill for self-respect effectiveness, keeping respect for yourself, using the FAST technique.      The patient participated in group and/or other PHP services (as noted separately) during this time. Asked appropriate questions and verbalized understanding of concepts taught.     End Time: 1055    Javin Nong L. Lucretia Roers

## 2018-05-14 NOTE — Unmapped (Deleted)
Group Treatment Note      Patient Name: Tasha Buckley    Start Time: 0900    Community Group (.PHPCOMMUNITYGROUP)  This clinician assessed patients??? physical and mental wellness via group discussion and review of daily check in document. Clinician also assessed patients' for presence of suicidal and /or homicidal ideation, plans or intentions and addressed patient concerns.    The patient participated in group and/or other PHP services (as noted separately) during this time. Patient completed check in sheet, denied SI/HI. Patient verbalized positive action taken in the past 24 hours and shared how time was spent.      Pt reports she had a good evening.  Pt states she took a nap when I got home from [facility], and then watched TV.  Pt reports she started to think about moving out this August, but then I had a meltdown when I thought about not taking my pet.  [facility] asked pt if she had been looking at apartments where she could keep her pet and she stated, Anthem yeah, I would only get a place where I could take him with me.  Pt states, my mother helped me to check the facts about my thoughts and I was able to calm down.  Pt states she slept well last night because I was exhausted after my meltdown.  Pt currently denies any struggles or issues to address with the treatment team.  Pt remains invested in treatment.      End Time: 0955    [name]

## 2018-05-14 NOTE — Unmapped (Addendum)
[facility] - Discharge Instructions/Recovery Plan    FOLLOW INSTRUCTIONS GIVEN BELOW.      Patient: Tasha Buckley            MRN: [id number]  Date:05/14/2018       Diet/Activity Instructions    [x]    General diet []    Special diet   []     Activity as tolerated []     Activity instructions     Scheduled Appointments  Call Delton See at [phone] to schedule appointments with outpatient providers.    Follow Up Care  [x]    Support groups/community resources   []    Medical equipment supplier   []    Home health agency   []    Physician to call with questions     Disposition Location  [x]    Home []    Skilled Nursing Facility   []    Acute Care []    Expired   []    Long Term Care      Review of Materials  [x]    Educational materials reviewed   []    Valuables & belongings returned   []    Medication reconciliation received and reviewed       Reason for Discharge  [x]    Completed treatment goals []    Refused treatment recommendations   [x]    Maximum benefit achieved []    Administratively discharged   []    Left treatment prematurely []    Referred to another treatment facility       Coping Skills Identified  [] Journal [] Read [] Take a walk [x] Phone a friend   []  Meditation [x] Deep breathing [] Puzzles [] Reframe thoughts   [] Physical activity [] Art [] Healthy snack [x] Listen to music   [] Progressive relaxation [] Laugh [] Take a bath/shower [] Visualization   [] Prayer [] Sing [] Play an instrument [] Seek information   [] Write a letter [] Attend support group [] Affirmations [x] Spend time with pet   [] Make alternatives list [] Yoga         Signs and Symptoms of Relapse  [] Trouble sleeping [x] Anxiety [] Trouble eating [] Eating too much   []  Sleeping too much [x] Anger [] Attendance issue [] Difficulty concentrating   [x] Sadness [] Using drugs [] Difficulty at school/work [] Not taking care of yourself   [] Hearing voices [] Hopelessness [] Drinking alcohol [x] Self harm feelings   [] Racing thoughts [] Loss of interest [] Seeing things  [] Headaches   [] Play an instrument [x] Irrational thoughts    [] Rituals [] Overexercising   [x] Isolation [] Suspiciousness [] Violent thoughts [] Stomach aches   [] No physical activity        Primary Emergency Contact Name and Number  Extended Emergency Contact Information  Primary Emergency Contact: [facility]  Address: [address] [place], [place] [address] [place]  Home Phone: [phone]  Relation: Mother      Treatment Goals Achieved 05/14/2018    Medications to Take at Home  Current Outpatient Medications   Medication Sig   ??? citalopram Take 1 tablet (40 mg total) by mouth daily.   ??? lamoTRIgine Take 1.5 tablets (150 mg total) by mouth daily. Indications: MOOD   ??? QUEtiapine Take 1 tablet (300 mg total) by mouth at bedtime. Indications: CALM MIND, MOOD     No current facility-administered medications for this visit.          I have read the above and understand the instructions. I have received a copy of these instructions and have all of my personal belongings with me.      -------------------------------------------------------------  Signature of patient/responsible party  Discharge mode: [x] Ambulatory [] Wheelchair [] Cart    Accompanied by: [x] Self    [] Family [] Ambulance [] Transport       Discharge RN:  Josiah Lobo  Date:05/14/2018 Time: 1:01 PM

## 2018-05-14 NOTE — Unmapped (Signed)
Group Treatment Note      Patient Name: Shakala Marlatt    Start Time: 1400    [facility] Experiential Mindfulness   Group leader engaged patients in mindfulness activity of practicing experiential wise mind.  Patients were instructed to focus on the task of responding to their environment as it relates to their 5 senses, does the environment inspire calmness or agitation etc.  Patients were encouraged to practice intentionally focusing on deep breathing techniques and progressive relaxation as skills available to increase effective coping and distress tolerance.      The patient participated in group and/or other PHP services (as noted separately) during this time. Patient fully engaged in group learning of progressive relaxation exercises combined with deep breathing techniques.  Patient completed two sets of progressive relaxation/deep breathing exercises and then shared if they felt an increase in their level of calmness and peacefulness.  Patient shared how feeling more calm and peaceful would benefit them as they pursue individual recovery and stability.      End Time: 1455    Jullien Granquist L. Lucretia Roers

## 2018-05-14 NOTE — Unmapped (Signed)
Group Treatment Note      Patient Name: Tasha Buckley    Date: 05/14/2018         Start: 1PM    End: 1:55PM    Group Type: Addictions     Group Name: Triggers/Urges        Group Objective: Patients identified external, internal, and sensory triggers/urges that contribute to substance use. Patients developed coping skills to use when faced with these challenges.      Attendance: Attended     Interactions: Interacted appropriately     Mood/Affect: Appropriate     Patient Participation: The patient participated in-group and/or other PHP services (as noted separately) during this time.  Pt informed that Mearl Latin, [facility], is supervising this writer    Laurence Ferrari, LCDC III

## 2018-05-14 NOTE — Unmapped (Signed)
Tasha Buckley is scheduled to discharge today from [facility]. Tasha Buckley denies SI. She rates depression 5/10 and anxiety 4/10; with 10 being high. Tasha Buckley identified 3 coping skills learned in PHP as:  1) Breathing  2) Opposite action  3) TIP  Dc [id number]

## 2018-05-14 NOTE — Unmapped (Signed)
[place]  [facility]  Partial Hospitalization Program    Discharge Summary      Patient Name: Tasha Buckley  MRN: [id number]  Duration: n/a    Admission date:  05/01/18    Discharge:   Date:  05/14/2018  Location: Home     Diagnoses on Admission:    MDD, recurrent, moderate  Cluster B??PD  GAD  SIB, last episode 1 week ago  H/o ETOH use, binge pattern  ??  ??  Assets/Strengths/Protective Factors:  employment history, self-expressions, sense of hope, sense of humor and supportive network  ??  Weaknesses/Limitations/Barriers to Treatment:  lacks insight and mental illness  ??  Attitudes and Behaviors that require change:  Affective instability/mood dysregulation  Self-harming impulses/behaviors  Suicidal ideation  Maladaptive coping strategies      Reason for Admission:     Tasha Buckley??is a 23 y.o.??year old White or Caucasian??female??who presented to the [facility] by way of her??OPP??c/o??worsening anxiety/depression that has led to self-harm. This patient has a history of??MDD and r/o Bipolar 2.   ??  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 She states she identifies with all 9 criteria. We discussed that DBT is the gold standard txt for this.??She finds this??diagnosis??very relieving.   ??  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 She feels she has sensory issues, is very sensitive to certain sounds.    Hospital Course Including Rationale for Medication Changes Made, Medically Focused Treatment Recommendations, and Patient Response to the Treatment Provided:      The patient was admitted on a voluntarily basis. The Patient attended 9  days of PHP total.     Earlier on today's date, I met with patient for purposes of discharge today. However, patient is reporting her discomfort with discharging today. She feels as though her ability to manage her symptoms without support of the program is impaired. She reports that on Saturday night??she had a meltdown and became upset with her older sister - pt reports her sister is a Public librarian - the sister began singing on their front porch, and it upset me because I don't have any talents like that. Pt reports she became very angry and agitated, yelling and hyperventilating. Pt reports on Monday night, she was in the care with her younger sister who is learning to drive - sister took a sharp turn, pt felt unsafe and started screaming at her. Pt admits she was so rude to her sister. Sister told her to sit in backseat on the way home so that she would not be distracted while driving. Pt didn't want to sit in the backseat so she began walking home. Parents tried to pick her up in their truck, but pt refused and walked the whole way home. Once she got home, she began lying on the floor crying, screaming and hyperventilating.?? I know I was being irrational, but in the moment I can't comprehend that. Pt admits she did not use her DBT skills in that moment and chose instead to react in an uncontrollable manner. Staff reviewed with patient the TIPS skill that she could have used in this moment of  feeling overwhelmed. Pt reports she started her menses on Sunday and she wonders if there is a hormonal component to her meltdowns. Denies active SI today, but obvious emotional dysregulation continues with this patient. Sleep is disrupted due to poor sleep hygiene. Stays up late, naps often in the afternoon/evening. Appetite up a bit since the increase in Seroquel. Discussed option of extending in PHP for another 5 days to ensure mood stability and symptom control. Initially, pt was agreeable to  extending for another week. Later in the day, she approached PHP staff to state she wanted to d/c today after all.     On the last day of PHP attendance there was no concern for this patient???s safety. The patient requested discharge and while the patient could clearly benefit from further PHP treatment, the treatment team did not believe that the patient was an acute risk of harming self or others and the patient consistently denied this. Also there was no evident behavior or verbalization of such during the partial hospitalization treatment. Therefore the team did not think that the patient was eligible for a 72 hour hold and honored the request to be discharged from this voluntary partial hospital program. The case was d/w Dr. Zollie Beckers. The patient will be discharged from the program by phone to follow-up in the community.     Medication changes included:   See Medication and supplement plan below.    Complications: N/A      Current Outpatient Medications   Medication Sig   ??? citalopram Take 1 tablet (40 mg total) by mouth daily.   ??? lamoTRIgine Take 1.5 tablets (150 mg total) by mouth daily. Indications: MOOD   ??? QUEtiapine Take 1 tablet (300 mg total) by mouth at bedtime. Indications: CALM MIND, MOOD     No current facility-administered medications for this visit.          Is the patient prescribed multiple antipsychotics? No      Was an FDA-approved tobacco cessation medication prescribed at discharge? No  The patient does not use tobacco products.      No Known Allergies      Pertinent Physical Findings:   ?? ??   Vitals:   ?? 05/01/18 0815   BP: 142/84   Pulse: 96   Resp: 16   Temp: 98 ??F (36.7 ??C)   SpO2: 99%   ??  ??  Patient reports pain to be:??0??out of 10, with 10 being the worst pain.  ??  Body mass index is 35.51 kg/m??.      Pertinent Lab/Test Findings:   ECG 12 lead   No results found.  ??  ??  Laboratory results from??Oct 2019??stay reviewed      Review of Systems:  n/a       MENTAL STATUS  EXAMINATION:  n/a      Discharge Diagnoses:  MDD, recurrent, moderate  Cluster B??PD  GAD  SIB, last episode 1 week ago  H/o ETOH use, binge pattern  ??    Medical Condition on Discharge: Stable  Psychiatric Condition on Discharge: Stable and Not imminently dangerous self/others  Functional Condition on Discharge:  Independent      Follow up in office with:   Janace Litten for medication management and Dr. Manual Meier for therapy.      Medication plans will include:   Continue Seroquel 300mg  hs (just increased last week)  INCREASE LTG to 150mg   Continue??Celexa??40mg  daily      Will have [facility] fax a copy  of the d/c summary to patient's OPP.        For detailed description of aftercare recommendations, please refer to the After Visit Summary.    Most recent physical with PCP reported by pt to be??October 2019.??  ??  RTW plan:??n/a  ??  She reports she is living at parents' home, all sharp objects have been locked away. She reports her medications are locked away also, and her mother is dosing pt nightly.??      Laveda Norman, MSN, APRN, PMHNP-BC  Psychiatric Nurse Practitioner  Board Certified in Internal Medicine, Psychiatry/Mental Health, and Addictions

## 2018-05-14 NOTE — Unmapped (Signed)
Group Treatment Note      Patient Name: Julicia Krieger    Start Time: 1100    DBT Interpersonal Effectiveness 4 VALIDATE (.PHPDBTINTERPERSONALEFFECTIVENESS4)  Taught patients??? how to increase the V in validate. Defined for patients??? what it means to validate and taught what, why and how to validate both verbally and non- verbally.     The patient participated in group and/or other PHP services (as noted separately) during this time.  Patient shared how they felt validating during the role play exercise as well as how they felt being validated.  Patient shared how they believe the use of validation will strengthen their relationships and assist them as they pursue recovery and stability.    End Time: 1155    [name]

## 2018-05-14 NOTE — Unmapped (Signed)
Group Treatment Note      Patient Name: Tasha Buckley    [facility] Time: 1455  Wrap Up Group:     Group Description: This clinician assessed patients' physical and mental wellness via group discussion and review of daily check out document. Assessed patients' for presence of suicidal and/or homicidal ideations, plans or intentions. Assessed and discussed patients' safety plans and addressed patient concerns.    Group Objective: Assess patients' mental and physical wellness. Assess patients for presence of suicidal or homicidal thoughts. Assessed patients' access to and ability to follow safety plan.      completed check out sheet, denied SI/HI and verbalized safety plan. She did not need to speak to staff 1:1 and took all belongings and checked out of group space.           End Time: 1500

## 2018-05-30 MED ORDER — QUEtiapine (SEROQUEL) 300 MG tablet
300 | ORAL_TABLET | Freq: Every evening | ORAL | 0 refills | Status: AC
Start: 2018-05-30 — End: 2018-06-25

## 2018-05-30 MED ORDER — citalopram (CELEXA) 40 MG tablet
40 | ORAL_TABLET | Freq: Every day | ORAL | 0 refills | Status: AC
Start: 2018-05-30 — End: 2018-06-25

## 2018-05-30 MED ORDER — lamoTRIgine (LAMICTAL) 100 MG tablet
100 | ORAL_TABLET | Freq: Every day | ORAL | 0 refills | Status: AC
Start: 2018-05-30 — End: 2018-06-25

## 2018-05-30 NOTE — Unmapped (Signed)
Received refill request from pt. Encouraged follow-up within the next 2-4 weeks.

## 2018-06-25 ENCOUNTER — Ambulatory Visit: Admit: 2018-06-25 | Discharge: 2018-07-02 | Payer: PRIVATE HEALTH INSURANCE | Attending: Psychiatric/Mental Health

## 2018-06-25 DIAGNOSIS — F339 Major depressive disorder, recurrent, unspecified: Secondary | ICD-10-CM

## 2018-06-25 MED ORDER — lamoTRIgine (LAMICTAL) 100 MG tablet
100 | ORAL_TABLET | Freq: Every day | ORAL | 2 refills | Status: AC
Start: 2018-06-25 — End: 2018-09-24

## 2018-06-25 MED ORDER — QUEtiapine (SEROQUEL) 300 MG tablet
300 | ORAL_TABLET | Freq: Every evening | ORAL | 2 refills | Status: AC
Start: 2018-06-25 — End: 2018-09-24

## 2018-06-25 MED ORDER — citalopram (CELEXA) 40 MG tablet
40 | ORAL_TABLET | Freq: Every day | ORAL | 2 refills | Status: AC
Start: 2018-06-25 — End: 2018-09-24

## 2018-06-25 MED ORDER — metFORMIN (GLUCOPHAGE) 500 MG tablet
500 | ORAL_TABLET | Freq: Two times a day (BID) | ORAL | 2 refills | Status: AC
Start: 2018-06-25 — End: 2018-09-24

## 2018-06-25 NOTE — Unmapped (Addendum)
Tasha Buckley   [facility]  Outpatient Follow-Up Appointment      Name: Tasha Buckley  Date: 06/25/2018  Duration: This was a phone conversation on 06/25/2018 in lieu of an in-person visit due to coronavirus emergency. Patient verified that they were at their home in [place], [place] during session. Patient provided verbal consent for an over the phone visit. I spent 10 to 14 minutes  on this call conducting an interview, performing a limited exam by phone, and educating the patient on my assessment and plan. No family members were present on the call. Tasha Buckley was spoken to for pharmacological management and monitoring of depression, anxiety and irritability.  Psychotherapy add on: N/A. Tasha Buckley continues to participate actively in treatment and progress is being made towards the goal of managing her depression, anxiety, and irritability. Tasha Buckley remains safe for outpatient with minimal risks.  Billing Code: [id number] TEL    Chief Complaint: Medication follow-up.     HPI: Tasha Buckley sounded calm and relaxed during today's phone session. This was her first outpatient appointment following her admission to [facility] in April since tele-health appointments give her anxiety (for this reason she has also not scheduled therapy since leaving the program). Since her discharge she feels things have been going well and her mood is in a better/stable place with no emotional outbursts. During the program she met a lot of fun people and learned new skills, her favorite and the most helpful being checking the facts. Clinician briefly discussed new diagnosis of borderline personality disorder; Tasha Buckley considered this to be an absolute relief and she no longer feels like she needs to constantly Google symptoms to figure out what's wrong. Anxiety has not been intense recently aside from her struggling when others drive her in the car. She has not struggled with any thoughts of self-harm or  suicide.    Recently Tasha Buckley has been getting along better with her sister and has been enjoying watching Netflix with her and their mother in the evenings as well as playing a murder mystery game. To help with her mood Tasha Buckley went to the store and bought a mini pool for the backyard and has enjoyed soaking in it as well as tanning. No changes in sleep or appetite. While Tasha Buckley feels recent medication changes have been helpful she has been struggling with weight gain and thinks she's gained anywhere from 20-30 lbs since starting Seroquel. She expressed frustration about this since she feels it's doing wonders for her mood and doesn't want to make changes. Discussed exercise habits, diet, follow-up labs within the next few months, and possibility of adding Metformin to medication regimen if well tolerated. No additional questions or concerns.    Current Outpatient Medications   Medication Sig   ??? citalopram Take 1 tablet (40 mg total) by mouth daily.   ??? lamoTRIgine Take 1.5 tablets (150 mg total) by mouth daily. Indications: MOOD   ??? metFORMIN Take 1 tablet (500 mg total) by mouth 2 times a day with meals.   ??? QUEtiapine Take 1 tablet (300 mg total) by mouth at bedtime. Indications: CALM MIND, MOOD     No current facility-administered medications for this visit.      Original Presentation to Outpatient Setting (11/06/17):  HPI: The client is a 23 year old Caucasian female with a history of anxiety and depression. Tasha Buckley (prefers the name Tasha Buckley) was calm, cooperative, and open to the therapeutic process during session. Tasha Buckley started the session by discussing that college has  always been difficult for her but this semester she completely lost it and made the decision to come home. The previous semester she had to come home early due to her anxiety and during the summer between her freshman and sophomore year she was hospitalized for suicidal ideation. She struggled to identify a trigger for her recent  meltdown; she was sitting on her couch and starting looking up jobs that she could get without a college degree and started hyperventilating and called her mother to tell her she wanted to drop out. Since the beginning of this semester she has struggled with the motivation to get up and go to class and she was unable to remember the last time where she had a week of consistent attendance. She described her moods as fluctuating; when she is with her roommates being social she feels great but when she is alone her thoughts race and she feels depressed. At times she's felt like she's on autopilot. She denies any changes in her appetite or sleep schedule. She discussed that she loves sleep and on days when she feels depressed she will sometimes take over-the-counter sleep aids during the day so she can relax and sleep through it.  ??  When discussing symptoms of depression Tasha Buckley endorsed sad moods, anhedonia, hypersomnia, difficulty getting out of bed in the morning, decreased energy and concentration, crying spells, passive thoughts of death, poor self-care, low self-esteem, and occasional worsening of her mood when she gets her period. She experiences meltdowns or panic attacks every few months that present with difficulty breathing, racing heart, extreme anxiety, and heightened sensitivity where she will snap at someone. She often feels overwhelmed, restless, and anxious to be around people and meeting new people. Tasha Buckley discussed that she has a family history of bipolar and endorsed several symptoms of mania including distractibility, some difficulty with sleep, increased goal directed activity (like starting a new diet or exercise plan that she never follows through on), flight of ideas, impulsive choices (buying a rat without any previous thought, dyeing her hair 3 times a month, going out late at night for food, seeking adventures, and once going into an abandoned building with a friend),  irritability, and anger outbursts. She struggled to identify a timeline or if these were happening during distinct episodes of time. She feels that she has poor impulses and judgement most of the time. She denies that these symptoms may have occurred for 7 days at a time but thinks it's possible that they have occurred 4 days at a time.  ??  Tasha Buckley endorsed some symptoms of ADHD including poor attention to detail, making careless mistakes on her homework or not turning things in, difficulty listening and sustaining attention unless she's very interested in the topic, inability to organize, often losing things, and difficulty sitting still. She denies ever being diagnosed with anorexia or bulimia but that she does have a history of horrible body image and some disordered eating by skipping occasional meals. She denies symptoms of PTSD, OCD, psychosis, paranoia, or delusional thinking. When discussing goals for treatment Tasha Buckley verbalized that she wants to get back on track by having her medications adjusted, start with a new therapist, get everything lined up for online school next year, and find a new job.    Past Psychiatric History:  Previous inpatient admission: Tasha Buckley was hospitalized once at [facility] from 08/29/16 to 08/31/16 for suicidal ideation and superficial cutting; the admission was triggered by being told by peers that she looked like she  had lost weight but when she weighed herself she saw that she actually hadn't lost any weight. She denies any hospitalizations since then. She sought care this month from [facility] but her family ultimately decided not to pursue due to financial reasons.  Previous history of violence to self or others: Tasha Buckley reports a brief history of self-harm by cutting; she started cutting superficially 2 weeks prior to her inpatient admission in 2018. She sometimes cuts now with the most recent episode on 11/01/17. She discussed passive suicidal ideation to drive her car  into a pole or overdose on medications but denies any intent. She denies any homicidal ideation or access to firearms.  Protective factors/strengths: Protective factors include her family, friends, and boyfriend. She discussed that after watching '13 Reasons Why' she thought of suicide as almost selfish, and the aftermath for the ones you care about is horrible. Strengths include her ability to listen to others and be compassionate.  Past/Current Outpatient Treatment: Tasha Buckley was seeing a Publishing rights manager at [facility] [place] [place] for medication management as well as a Paramedic. She has not seen the therapist at all this semester; while she wanted to reach out Tasha Buckley expressed anxiety because she ghosted her therapist last year and didn't feel like explaining it. She is currently seeking a therapist and her primary care provider was prescribing medications until she could meet with a psychiatric provider.  Past Psychiatric Medication Trials: During her inpatient admission in August of 2018 she was prescribed Wellbutrin XL 150 mg as an augmentation to her Celexa but she stopped taking it a year ago due to no longer finding it helpful. She was prescribed Abilify in the past and found the side effects to be horrible including drooling, akathisia, and some difficulty breathing. She has noticed similar symptoms with the Seroquel (mainly akathisia) but has not found it to be as severe. She denies any additional medication trials.    Substance Use History:  ETOH: Denies regular alcohol use. Tasha Buckley discussed that this was a pretty big issue in high school and that she'd been abstaining from alcohol for the past few years. She drank twice in the last month but required 10+ drinks due to her high tolerance.  MJ: Denies history or current use of marijuana.  Caffeine: Endorses drinking 1 soda daily.  Tobacco: Denies history or current use of tobacco.  Illicits: Denies history or current use of any  additional illicit substances.    Allergies to Food, Medications, Products:  No Known Allergies    Past Medical, Surgical, Family History:  Past Medical History:   Diagnosis Date   ??? Anxiety    ??? Concussion     mild, age [age], fell on treadmill at gym   ??? Depression    ??? Mood swings      Non-psychiatric medications: None.  Most recent labs: Available labs reviewed from August 2018 through chart review.   Chronic illnesses: Tasha Buckley reports a history of one concussion in 2016.  Surgeries: Denies any surgical procedures.     Family History   Problem Relation Age of Onset   ??? Alcohol abuse Mother    ??? Bipolar disorder Mother    ??? Depression Mother    ??? Anxiety disorder Sister    ??? OCD Sister    ??? Learning disabilities Sister      Social History:  Lives with / Family: Tasha Buckley recently moved back in with her parents, who are still married, in [place], [place]. She  has one sister (age 49) with a history of OCD and nonverbal learning disorder who lives in [place]. She has one dog, cat, and rat. While she is sad to be leaving her friends behind in [place] she otherwise has not found the transition to be difficult and feels she made the right decision. Her parents have been very supportive through the transition as well.  Development / Childhood: Tasha Buckley grew up in [place] until age 70 and her family moved to [place]. When her father lost his job during her freshman year of college the family moved back to [place] where he was able to find employment again. Tasha Buckley denies any developmental delays or significant childhood stressors.  Education: Tasha Buckley was attending the [employer] [place], [place]. This year she started her junior year studying human development and family studies. In the spring she intends to start online school through [employer] and to switch her focus to substance abuse counseling. She attended high school in [place].  Occupation: Over the summer Tasha Buckley worked  at [employer] and while in college she worked at [employer]. She intends to find work as soon as possible.  Leisure: In her downtime Tasha Buckley enjoys being with her friends, talking to her boyfriend, watching TV and movies (mostly crime dramas and documentaries), and would like to get into horseback riding.  Relationships: Tasha Buckley has been in a relationship with her boyfriend since the summer of 2019; they met each other working at [facility] [employer]. His family lives here and he studies Lawyer in [place] state. Tasha Buckley considers her college roommates to be her best friends.  Spiritual: Identifies as [employer] Knuckles; in the past Tasha Buckley has been highly involved with the [employer] she was attending in [place].  Legal: Reports receiving a ticket for expired tags in 2018.  Military: Denies Financial planner.  Sexuality: Heterosexual and identifies with birth gender.  Trauma: Tasha Buckley denies any history of sexual, physical, or emotional abuse. She denies any significant losses.  ??  Physical Review Of Systems:  Constitutional: Denies fatigue, malaise, fever, chills.  HEENT: Denies ear/eye pain or drainage, changes in vision or hearing, congestion or throat pain.  Respiratory: Denies difficulty breathing or chest pain, aside from when she experiences a panic attack.  Cardiovascular: Denies palpitations or irregular heart beats.  Gastrointestinal: Denies abd pain, N/V, diarrhea or constipation.  Genitourinary: Denies frequency/urgency or pain upon urination.  Integument/Breast: Denies abnormal lesions or changes in skin integrity.  Hematologic/Lymphatic: Denies clotting issues, increased bruising, swollen or tender lymph nodes.  Musculoskeletal: Denies muscle/joint pain or swelling.  Neurological: Denies headache, tremor, dizziness, change in consciousness.  ??  Objective:  Weight: 203 lbs (taken from visit on 11/06/17).  Vital Signs: There were no vitals filed for this visit.    Mental Status  Exam:  General: Unable to assess; phone session.  Speech: Normal rate, rhythm, articulations. Normal quality. Soft-spoken at times.  Motor: Unable to assess; phone session.  Mood/ Affect: Mood described as ???better???, unable to observe affect.  Perception: Denies auditory/visual/tactile/olfactory hallucinations.  Thought Content: Denies delusions, paranoia.   Thought Process: Linear, goal-directed.  Memory: Short and long-term memory grossly intact as evidenced by historical recall of verifiable facts from patient???s history.  Cognitive: Alert and oriented to all spheres, attention/concentration/focus intact and at patient???s baseline.  Safety: Endorses passive suicidal thoughts. Denies intent, plan. Denies homicidal thoughts, intent plan. Denies plan for self-injury but endorses thoughts.  Insight/ Judgement: Fair insight  and limited judgement.  ??  Diagnoses:  Axis I: Major depressive disorder, recurrent episode with mixed features (CMS Dx); rule out bipolar II  Axis II: Borderline personality disorder (CMS Dx)  Axis III: None  ??  Assessment/Plan:  1.??Discussed medication planning at length. Tasha Buckley is upset and displeased about weight gain with Seroquel but feels this has been the most helpful medication for mood management. Discussed trying lifestyle changes and adding Metformin prior to decreasing dose or considering switching to a new medication due to [facility] and her mother's history of side effects with mood stabilizers. Continue current medication regimen of Celexa 40 mg and Seroquel 300 mg. Tasha Buckley was previously aware of side effects to monitor for due to her bad experience with Abilify.??Discussed/reviewed mechanism of action of antipsychotics??(dopamine and for some also serotonin); and the purpose of this medication is to help manage mood and behavioral problems, and some for assistance with sleep. Side effects including but are not limited to sedation, orthostatic hypotension, dizziness, constipation, dry  mouth, agitation, sexual side effects, glucose dysregulation, dyslipidemias, weight gain, increase in breast size or nipple discharge, hyperprolactinemia, drooling, lower the seizure threshold, cardiac arrhythmias, sudden cardiac death,??neuroleptic malignant syndrome (although rare to occur and even more rare with second generation antipsychotics can occur and symptoms include but are not limited to: fever, confusion, rigidity, tachycardia, dysphagia),??extrapyramidal symptoms (side effects including but not limited to: parkinsonism symptoms including??tremor, balance problems, stiffness, slow movement, shuffle gait, ??akathisia or restlessness,??dystonia, and??tardive??dyskinesia. ??We have talked about that tardive dyskinesia (TD) possibly irreversible movements of my mouth, jaw, tongue, hands, feet, or body, discussed more likely to occur with older antipsychotic medicine and that it can occur spontaneously even when someone has never taken these medicines.??Continue Lamictal 150 mg daily (increased during PHP). Discussed risks, benefits, and side effects with pt including but not limited to the mechanism of action of Lamictal and that it is an anticonvulsant/mood stabilizer that is used to treat symptoms of bipolar. Discussed side effects of sedation, dizziness, nausea, possibility of decreased efficacy of oral contraceptives, the importance of compliance, alcohol use, and skin reactions. Discussed with pt Stevens-Johnson Syndrome which is a severe adverse reaction of the skin and mucous membranes and requires immediate medical attention, symptoms often begins with fever and flu-like symptoms and then within a few days a skin rash appears, the skin may blister and peel usually starting on the face and the chest and then spreading. Discussed that Stevens-Johnson syndrome can be life threatening and if symptoms occur report to the emergency room immediately. Start Metformin 500 mg twice daily with meals  2. Return to  clinic in 4-8 weeks for follow-up  3. Continue therapy with Salvadore Oxford, PsyD at [facility]  4. Pt/family to call or e-mail with questions or concerns  5. Pt in agreement with this plan  6. Discussed exercise plan as well as diet  Risk of Self Harm: Moderate  Risk of Harm to Others: Low  Assets/Strengths/Protective Factors: ability to engage, cognitive skills, creativity, prior successful treatment, sense of humor and suppportive network  Weaknesses/Limitations/Barriers to Treatment: lacks impulse control, lacks judgement and mental illness  Attitudes and Behaviors that require change: Affective instability/mood dysregulation, self-harming impulses/behaviors, maladaptive coping strategies  ??  Janace Litten, APRN-CNP  Psychiatric Mental-Health Nurse Practitioner   ??

## 2018-09-24 ENCOUNTER — Ambulatory Visit: Admit: 2018-09-24 | Payer: PRIVATE HEALTH INSURANCE | Attending: Psychiatric/Mental Health

## 2018-09-24 DIAGNOSIS — F339 Major depressive disorder, recurrent, unspecified: Secondary | ICD-10-CM

## 2018-09-24 MED ORDER — lamoTRIgine (LAMICTAL) 100 MG tablet
100 | ORAL_TABLET | Freq: Every day | ORAL | 1 refills | Status: AC
Start: 2018-09-24 — End: 2018-11-28

## 2018-09-24 MED ORDER — citalopram (CELEXA) 40 MG tablet
40 | ORAL_TABLET | Freq: Every day | ORAL | 1 refills | Status: AC
Start: 2018-09-24 — End: 2019-04-27

## 2018-09-24 MED ORDER — QUEtiapine (SEROQUEL) 300 MG tablet
300 | ORAL_TABLET | Freq: Every evening | ORAL | 1 refills | Status: AC
Start: 2018-09-24 — End: 2018-11-03

## 2018-09-24 MED ORDER — metFORMIN (GLUCOPHAGE) 500 MG tablet
500 | ORAL_TABLET | Freq: Two times a day (BID) | ORAL | 1 refills | Status: AC
Start: 2018-09-24 — End: 2019-04-27

## 2018-09-24 NOTE — Unmapped (Signed)
[place]   [facility]  Outpatient Follow-Up Appointment      Name: Tasha Buckley  Date: 09/24/2018  Duration: On 09/24/2018 I met with Tasha Buckley in my office for a 30 minute visit. She was seen face to face. No family members were present for the visit. Tasha Buckley was seen for pharmacological management and monitoring of depression, anxiety and irritability.  Psychotherapy add on: N/A. Tasha Buckley continues to participate actively in treatment and progress is being made towards the goal of managing her depression, anxiety, and irritability. Tasha Buckley remains safe for outpatient with minimal risks.  Billing Code: [id number]    Chief Complaint: I'm actually doing pretty well.     HPI: Tasha Buckley was calm and relaxed during today's session and did not appear as on edge as she has in previous sessions. While historically Tasha Buckley has only made appointments with this clinician while in crisis, today she is doing really great and just needs refills. 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 Sleep has become more normalized and her mood has remained stable over the last few months. She attributes her positive mood in part to medications working but also her older sister moving out and back to [place]. Tasha Buckley still spends time with her mother and younger sister but feels better since she has some space now when she goes to work. Despite COVID-19 restrictions she and her younger sister have been trying to find different things to do out of the house while staying safe. She denied any recent thoughts of suicide/self-harm. No significant changes in sleep or appetite.    Clinician obtained vital signs and blood pressure today, noting that Tasha Buckley is up approximately 50 lbs since October. This has been an ongoing issue for Tasha Buckley since increasing Seroquel but she also acknowledged that she eats terribly and doesn't  exercise much. She feels like adding Metformin to her regimen in April has been helpful and now that COVID-10 restrictions are lightening up some she and her sister intend to get a gym membership. As far as nutrition goes she has been making more of an effort to eat smaller meals throughout the day as opposed to skipping meals. Discussed possibility of decreasing Seroquel in the future now that Lamictal is in the regimen; Tasha Buckley is open to this idea but also doesn't seem as distressed about her weight and body image as she has been during previous sessions. Discussed intention to follow up in 3-6 months but encouraged to call sooner if indicated. No additional questions or concerns at this time.    Current Outpatient Medications   Medication Sig   ??? citalopram Take 1 tablet (40 mg total) by mouth daily.   ??? lamoTRIgine Take 1.5 tablets (150 mg total) by mouth daily. Indications: MOOD   ??? metFORMIN Take 1 tablet (500 mg total) by mouth 2 times a day with meals.   ??? QUEtiapine Take 1 tablet (300 mg total) by mouth at bedtime. Indications: CALM MIND, MOOD     No current facility-administered medications for this visit.      Original Presentation to Outpatient Setting (11/06/17):  HPI: The client is a 23 year old Caucasian female with a history of anxiety and depression. Tasha Buckley (prefers the name Tasha Buckley) was calm, cooperative, and open to the therapeutic process during session. Tasha Buckley started the session by discussing that college has always been difficult for her but this semester she completely lost it  and made the decision to come home. The previous semester she had to come home early due to her anxiety and during the summer between her freshman and sophomore year she was hospitalized for suicidal ideation. She struggled to identify a trigger for her recent meltdown; she was sitting on her couch and starting looking up jobs that she could get without a college degree and started hyperventilating and called her  mother to tell her she wanted to drop out. Since the beginning of this semester she has struggled with the motivation to get up and go to class and she was unable to remember the last time where she had a week of consistent attendance. She described her moods as fluctuating; when she is with her roommates being social she feels great but when she is alone her thoughts race and she feels depressed. At times she's felt like she's on autopilot. She denies any changes in her appetite or sleep schedule. She discussed that she loves sleep and on days when she feels depressed she will sometimes take over-the-counter sleep aids during the day so she can relax and sleep through it.  ??  When discussing symptoms of depression Tasha Buckley endorsed sad moods, anhedonia, hypersomnia, difficulty getting out of bed in the morning, decreased energy and concentration, crying spells, passive thoughts of death, poor self-care, low self-esteem, and occasional worsening of her mood when she gets her period. She experiences meltdowns or panic attacks every few months that present with difficulty breathing, racing heart, extreme anxiety, and heightened sensitivity where she will snap at someone. She often feels overwhelmed, restless, and anxious to be around people and meeting new people. Tasha Buckley discussed that she has a family history of bipolar and endorsed several symptoms of mania including distractibility, some difficulty with sleep, increased goal directed activity (like starting a new diet or exercise plan that she never follows through on), flight of ideas, impulsive choices (buying a rat without any previous thought, dyeing her hair 3 times a month, going out late at night for food, seeking adventures, and once going into an abandoned building with a friend), irritability, and anger outbursts. She struggled to identify a timeline or if these were happening during distinct episodes of time. She feels that she has poor impulses  and judgement most of the time. She denies that these symptoms may have occurred for 7 days at a time but thinks it's possible that they have occurred 4 days at a time.  ??  Tasha Buckley endorsed some symptoms of ADHD including poor attention to detail, making careless mistakes on her homework or not turning things in, difficulty listening and sustaining attention unless she's very interested in the topic, inability to organize, often losing things, and difficulty sitting still. She denies ever being diagnosed with anorexia or bulimia but that she does have a history of horrible body image and some disordered eating by skipping occasional meals. She denies symptoms of PTSD, OCD, psychosis, paranoia, or delusional thinking. When discussing goals for treatment Tasha Buckley verbalized that she wants to get back on track by having her medications adjusted, start with a new therapist, get everything lined up for online school next year, and find a new job.    Past Psychiatric History:  Previous inpatient admission: Tasha Buckley was hospitalized once at [facility] from 08/29/16 to 08/31/16 for suicidal ideation and superficial cutting; the admission was triggered by being told by peers that she looked like she had lost weight but when she weighed herself she saw that she  actually hadn't lost any weight. She denies any hospitalizations since then. She sought care this month from [facility] but her family ultimately decided not to pursue due to financial reasons.  Previous history of violence to self or others: Tasha Buckley reports a brief history of self-harm by cutting; she started cutting superficially 2 weeks prior to her inpatient admission in 2018. She sometimes cuts now with the most recent episode on 11/01/17. She discussed passive suicidal ideation to drive her car into a pole or overdose on medications but denies any intent. She denies any homicidal ideation or access to firearms.  Protective factors/strengths: Protective factors  include her family, friends, and boyfriend. She discussed that after watching '13 Reasons Why' she thought of suicide as almost selfish, and the aftermath for the ones you care about is horrible. Strengths include her ability to listen to others and be compassionate.  Past/Current Outpatient Treatment: Tasha Buckley was seeing a Publishing rights manager at [facility] [place] for medication management as well as a Paramedic. She has not seen the therapist at all this semester; while she wanted to reach out Tasha Buckley expressed anxiety because she ghosted her therapist last year and didn't feel like explaining it. She is currently seeking a therapist and her primary care provider was prescribing medications until she could meet with a psychiatric provider.  Past Psychiatric Medication Trials: During her inpatient admission in August of 2018 she was prescribed Wellbutrin XL 150 mg as an augmentation to her Celexa but she stopped taking it a year ago due to no longer finding it helpful. She was prescribed Abilify in the past and found the side effects to be horrible including drooling, akathisia, and some difficulty breathing. She has noticed similar symptoms with the Seroquel (mainly akathisia) but has not found it to be as severe. She denies any additional medication trials.    Substance Use History:  ETOH: Denies regular alcohol use. Tasha Buckley discussed that this was a pretty big issue in high school and that she'd been abstaining from alcohol for the past few years. She drank twice in the last month but required 10+ drinks due to her high tolerance.  MJ: Denies history or current use of marijuana.  Caffeine: Endorses drinking 1 soda daily.  Tobacco: Denies history or current use of tobacco.  Illicits: Denies history or current use of any additional illicit substances.    Allergies to Food, Medications, Products:  No Known Allergies    Past Medical, Surgical, Family History:  Past Medical History:    Diagnosis Date   ??? Anxiety    ??? Concussion     mild, age 34-18, fell on treadmill at gym   ??? Depression    ??? Mood swings    Non-psychiatric medications: None.  Most recent labs: Available labs reviewed from August 2018 through chart review.   Chronic illnesses: Tasha Buckley reports a history of one concussion in 2016.  Surgeries: Denies any surgical procedures.     Family History   Problem Relation Age of Onset   ??? Alcohol abuse Mother    ??? Bipolar disorder Mother    ??? Depression Mother    ??? Anxiety disorder Sister    ??? OCD Sister    ??? Learning disabilities Sister      Social History:  Lives with / Family: Tasha Buckley recently moved back in with her parents, who are still married, in [place], [place]. She has one sister (age 50) with a history of OCD and nonverbal learning disorder  who lives in [place]. She has one dog, cat, and rat. While she is sad to be leaving her friends behind in [place] she otherwise has not found the transition to be difficult and feels she made the right decision. Her parents have been very supportive through the transition as well.  Development / Childhood: Tasha Buckley grew up in [place] until age 75 and her family moved to [place]. When her father lost his job during her freshman year of college the family moved back to [place] where he was able to find employment again. Tasha Buckley denies any developmental delays or significant childhood stressors.  Education: Tasha Buckley was attending the [employer] [place] [place]. This year she started her junior year studying human development and family studies. In the spring she intends to start online school through [employer] and to switch her focus to substance abuse counseling. She attended high school in [place].  Occupation: Over the summer Tasha Buckley worked at [employer] and while in college she worked at [employer]. She intends to find work as soon as possible.  Leisure: In her downtime Tasha Buckley enjoys being with  her friends, talking to her boyfriend, watching TV and movies (mostly crime dramas and documentaries), and would like to get into horseback riding.  Relationships: Tasha Buckley has been in a relationship with her boyfriend since the summer of 2019; they met each other working at [employer] [facility] [employer]. His family lives here and he studies Lawyer in [place] state. Tasha Buckley considers her college roommates to be her best friends.  Spiritual: Identifies as [employer] Knuckles; in the past Tasha Buckley has been highly involved with the [employer] she was attending in [place].  Legal: Reports receiving a ticket for expired tags in 2018.  Military: Denies Financial planner.  Sexuality: Heterosexual and identifies with birth gender.  Trauma: Tasha Buckley denies any history of sexual, physical, or emotional abuse. She denies any significant losses.  ??  Physical Review Of Systems:  Constitutional: Denies fatigue, malaise, fever, chills.  HEENT: Denies ear/eye pain or drainage, changes in vision or hearing, congestion or throat pain.  Respiratory: Denies difficulty breathing or chest pain, aside from when she experiences a panic attack.  Cardiovascular: Denies palpitations or irregular heart beats.  Gastrointestinal: Denies abd pain, N/V, diarrhea or constipation.  Genitourinary: Denies frequency/urgency or pain upon urination.  Integument/Breast: Denies abnormal lesions or changes in skin integrity.  Hematologic/Lymphatic: Denies clotting issues, increased bruising, swollen or tender lymph nodes.  Musculoskeletal: Denies muscle/joint pain or swelling.  Neurological: Denies headache, tremor, dizziness, change in consciousness.  ??  Objective:  Weight: 256.2 lbs (up 53 lbs since visit on 11/06/17).  Vital Signs:   Vitals:    09/24/18 1450   BP: 115/84   Pulse: 87   Resp: 16   Integument: No concerning moles or nevi. No areas of impaired skin integrity.  Musculoskeletal: Normal gait and station. Full ROM all extremities.  ??  Mental  Status Exam:  General: Appropriately dressed??in work appropriate clothing, polite, cooperative, good eye contact. Appears stated age.  Speech: Normal rate, rhythm, articulations. Normal quality. Soft-spoken at times.  Motor: No atrophy or abnormal movements. Normal gait and station.  Mood/ Affect: Mood described as ???really good???, affect??congruent.  Perception: Denies auditory/visual/tactile/olfactory hallucinations.  Thought Content: Denies delusions, paranoia.   Thought Process: Linear, goal-directed.  Memory: Short and long-term memory grossly intact as evidenced by historical recall of verifiable facts from patient???s history.  Cognitive: Alert and oriented to  all spheres, attention/concentration/focus intact and at patient???s baseline.  Safety:??Endorses passive??suicidal thoughts. Denies??intent, plan. Denies homicidal thoughts, intent plan. Denies plan for self-injury but endorses thoughts.  Insight/ Judgement:??Fair insight and limited judgement.  ??  ??  Diagnoses:  Axis I: Major depressive disorder, recurrent episode with mixed features (CMS Dx); rule out bipolar II  Axis II: Borderline personality disorder (CMS Dx)  Axis III: None  ??  Assessment/Plan:  1. Continue current medication regimen of Celexa 40 mg and Seroquel 300 mg. Tasha Buckley was previously aware of side effects to monitor for due to her bad experience with Abilify.??Discussed/reviewed mechanism of action of antipsychotics??(dopamine and for some also serotonin); and the purpose of this medication is to help manage mood and behavioral problems, and some for assistance with sleep. Side effects including but are not limited to sedation, orthostatic hypotension, dizziness, constipation, dry mouth, agitation, sexual side effects, glucose dysregulation, dyslipidemias, weight gain, increase in breast size or nipple discharge, hyperprolactinemia, drooling, lower the seizure threshold, cardiac arrhythmias, sudden cardiac death,??neuroleptic malignant syndrome  (although rare to occur and even more rare with second generation antipsychotics can occur and symptoms include but are not limited to: fever, confusion, rigidity, tachycardia, dysphagia),??extrapyramidal symptoms (side effects including but not limited to: parkinsonism symptoms including??tremor, balance problems, stiffness, slow movement, shuffle gait, ??akathisia or restlessness,??dystonia, and??tardive??dyskinesia. ??We have talked about that tardive dyskinesia (TD) possibly irreversible movements of my mouth, jaw, tongue, hands, feet, or body, discussed more likely to occur with older antipsychotic medicine and that it can occur spontaneously even when someone has never taken these medicines.??Continue Lamictal 150 mg daily. Discussed risks, benefits, and side effects with pt including but not limited to the mechanism of action of Lamictal and that it is an anticonvulsant/mood stabilizer that is used to treat symptoms of bipolar. Discussed side effects of sedation, dizziness, nausea, possibility of decreased efficacy of oral contraceptives, the importance of compliance, alcohol use, and skin reactions. Discussed with pt Stevens-Johnson Syndrome which is a severe adverse reaction of the skin and mucous membranes and requires immediate medical attention, symptoms often begins with fever and flu-like symptoms and then within a few days a skin rash appears, the skin may blister and peel usually starting on the face and the chest and then spreading. Discussed that Stevens-Johnson syndrome can be life threatening and if symptoms occur report to the emergency room immediately. Continue Metformin 500 mg twice daily with meals  2. Return to clinic in 3-6 months for follow-up  3. Continue outpatient therapy (Tasha Buckley recently started seeing a different provider at another facility)  4. Pt/family to call or e-mail with questions or concerns  5. Pt in agreement with this plan  6. Discussed exercise plan as well as diet  Risk of Self  Harm: Moderate  Risk of Harm to Others: Low  Assets/Strengths/Protective Factors: ability to engage, cognitive skills, creativity, prior successful treatment, sense of humor and suppportive network  Weaknesses/Limitations/Barriers to Treatment: lacks impulse control, lacks judgement and mental illness  Attitudes and Behaviors that require change: Affective instability/mood dysregulation, self-harming impulses/behaviors, maladaptive coping strategies  Decision making complexity: moderate to high; components considered and discussed include patient safety, differential diagnosis, previous medication trials, risks of medication complications, risk of symptom relapse, and medication side effects    Janace Litten, APRN-CNP  Psychiatric Mental-Health Nurse Practitioner   ??

## 2018-10-29 ENCOUNTER — Ambulatory Visit: Payer: PRIVATE HEALTH INSURANCE | Attending: Psychiatric/Mental Health

## 2018-10-29 NOTE — Progress Notes (Signed)
CX <24

## 2018-11-03 ENCOUNTER — Ambulatory Visit: Admit: 2018-11-03 | Payer: PRIVATE HEALTH INSURANCE | Attending: Psychiatric/Mental Health

## 2018-11-03 DIAGNOSIS — F339 Major depressive disorder, recurrent, unspecified: Secondary | ICD-10-CM

## 2018-11-03 MED ORDER — QUEtiapine (SEROQUEL) 100 MG tablet
100 | ORAL_TABLET | Freq: Every evening | ORAL | 0 refills | Status: AC
Start: 2018-11-03 — End: 2018-11-28

## 2018-11-03 NOTE — Unmapped (Signed)
[place]   [facility]  Outpatient Follow-Up Appointment      Name: Tasha Buckley  Date: 11/03/2018  Duration: On 11/03/2018 I met with Tasha Buckley in my office for a 30 minute visit. She was seen face to face. No family members were present for the visit. Tasha Buckley was seen for pharmacological management and monitoring of depression, anxiety and irritability.  Psychotherapy add on: N/A. [name] continues to participate actively in treatment and progress is being made towards the goal of managing her depression, anxiety, and irritability. Tasha Buckley remains safe for outpatient with minimal risks.  Billing Code: [id number]    Chief Complaint: I need to get off the Seroquel.     HPI: Following her last appointment at the end of August, then intention was for [facility] to follow-up with this provider in 3-6 months. She scheduled for today due to wanting to make medication changes sooner rather than later, especially in light of her upcoming move. While during her last appointment Tasha Buckley wasn't concerned about weight gain and felt like the pros of Seroquel outweighed the cons, today she cannot take it anymore and needs to get off of it. She presented a list of reasons primarily related to weight; [name] has become triggered whenever she looks at herself and she's experienced frustration at the lack of weight loss despite eating healthier and exercising regular. Recently she had a few dark nights where she wanted to get on the dark web and find a tapeworm to take care of the issue. Then she considered stopping the medication herself cold-turkey. After talking to her mother about it she was encouraged to come in and discuss this further with this clinician. While Seroquel has improved mood and impulsivity for Tasha Buckley this clinician encouraged slowly decreasing it and discussed the possibility of increasing Lamictal or exploring another alternative like Latuda. It was also discussed that due to  her newer diagnosis of borderline personality disorder she may not need an atypical antipsychotic.    Aside from the issues with weight [name] is doing well overall. In March she plans to move to [place] and live with her sister for a few months just because she can. She expressed that due to her age and relationship status this is her time to be a free spirit and she wants to take advantage of it. Work has gone well and by the time she moves she will have a year of experience which she feels good about. No significant changes in sleep or appetite. She was excited to share the new tattoo of flowers she got done on her forearm last week. Tasha Buckley denied any concerns for self-harm/suicidal ideation. Discussed intention to follow up in 3-4 weeks or sooner if indicated for worsening mood while decreasing medication. No additional questions or concerns at this time.    Current Outpatient Medications   Medication Sig   ??? citalopram Take 1 tablet (40 mg total) by mouth daily.   ??? lamoTRIgine Take 1.5 tablets (150 mg total) by mouth daily. Indications: MOOD   ??? metFORMIN Take 1 tablet (500 mg total) by mouth 2 times a day with meals.   ??? QUEtiapine Take 2.5 tablets (250 mg total) by mouth at bedtime. Indications: CALM MIND, MOOD     No current facility-administered medications for this visit.      Original Presentation to Outpatient Setting (11/06/17):  HPI: The client is a 23 year old Caucasian female with a history of anxiety and depression. Tasha Buckley (prefers the name [name])  was calm, cooperative, and open to the therapeutic process during session. Tasha Buckley started the session by discussing that college has always been difficult for her but this semester she completely lost it and made the decision to come home. The previous semester she had to come home early due to her anxiety and during the summer between her freshman and sophomore year she was hospitalized for suicidal ideation. She struggled to identify a trigger  for her recent meltdown; she was sitting on her couch and starting looking up jobs that she could get without a college degree and started hyperventilating and called her mother to tell her she wanted to drop out. Since the beginning of this semester she has struggled with the motivation to get up and go to class and she was unable to remember the last time where she had a week of consistent attendance. She described her moods as fluctuating; when she is with her roommates being social she feels great but when she is alone her thoughts race and she feels depressed. At times she's felt like she's on autopilot. She denies any changes in her appetite or sleep schedule. She discussed that she loves sleep and on days when she feels depressed she will sometimes take over-the-counter sleep aids during the day so she can relax and sleep through it.  ??  When discussing symptoms of depression Tasha Buckley endorsed sad moods, anhedonia, hypersomnia, difficulty getting out of bed in the morning, decreased energy and concentration, crying spells, passive thoughts of death, poor self-care, low self-esteem, and occasional worsening of her mood when she gets her period. She experiences meltdowns or panic attacks every few months that present with difficulty breathing, racing heart, extreme anxiety, and heightened sensitivity where she will snap at someone. She often feels overwhelmed, restless, and anxious to be around people and meeting new people. Tasha Buckley discussed that she has a family history of bipolar and endorsed several symptoms of mania including distractibility, some difficulty with sleep, increased goal directed activity (like starting a new diet or exercise plan that she never follows through on), flight of ideas, impulsive choices (buying a rat without any previous thought, dyeing her hair 3 times a month, going out late at night for food, seeking adventures, and once going into an abandoned building with a  friend), irritability, and anger outbursts. She struggled to identify a timeline or if these were happening during distinct episodes of time. She feels that she has poor impulses and judgement most of the time. She denies that these symptoms may have occurred for 7 days at a time but thinks it's possible that they have occurred 4 days at a time.  ??  Tasha Buckley endorsed some symptoms of ADHD including poor attention to detail, making careless mistakes on her homework or not turning things in, difficulty listening and sustaining attention unless she's very interested in the topic, inability to organize, often losing things, and difficulty sitting still. She denies ever being diagnosed with anorexia or bulimia but that she does have a history of horrible body image and some disordered eating by skipping occasional meals. She denies symptoms of PTSD, OCD, psychosis, paranoia, or delusional thinking. When discussing goals for treatment Tasha Buckley verbalized that she wants to get back on track by having her medications adjusted, start with a new therapist, get everything lined up for online school next year, and find a new job.    Past Psychiatric History:  Previous inpatient admission: [name] was hospitalized once at [facility] from 08/29/16 to 08/31/16  for suicidal ideation and superficial cutting; the admission was triggered by being told by peers that she looked like she had lost weight but when she weighed herself she saw that she actually hadn't lost any weight. She denies any hospitalizations since then. She sought care this month from [facility] but her family ultimately decided not to pursue due to financial reasons.  Previous history of violence to self or others: [name] reports a brief history of self-harm by cutting; she started cutting superficially 2 weeks prior to her inpatient admission in 2018. She sometimes cuts now with the most recent episode on 11/01/17. She discussed passive suicidal ideation to drive  her car into a pole or overdose on medications but denies any intent. She denies any homicidal ideation or access to firearms.  Protective factors/strengths: Protective factors include her family, friends, and boyfriend. She discussed that after watching '13 Reasons Why' she thought of suicide as almost selfish, and the aftermath for the ones you care about is horrible. Strengths include her ability to listen to others and be compassionate.  Past/Current Outpatient Treatment: [name] was seeing a Publishing rights manager at [facility] [place] for medication management as well as a Paramedic. She has not seen the therapist at all this semester; while she wanted to reach out Tasha Buckley expressed anxiety because she ghosted her therapist last year and didn't feel like explaining it. She is currently seeking a therapist and her primary care provider was prescribing medications until she could meet with a psychiatric provider.  Past Psychiatric Medication Trials: During her inpatient admission in August of 2018 she was prescribed Wellbutrin XL 150 mg as an augmentation to her Celexa but she stopped taking it a year ago due to no longer finding it helpful. She was prescribed Abilify in the past and found the side effects to be horrible including drooling, akathisia, and some difficulty breathing. She has noticed similar symptoms with the Seroquel (mainly akathisia) but has not found it to be as severe. She denies any additional medication trials.    Substance Use History:  ETOH: Denies regular alcohol use. Tasha Buckley discussed that this was a pretty big issue in high school and that she'd been abstaining from alcohol for the past few years. She drank twice in the last month but required 10+ drinks due to her high tolerance.  MJ: Denies history or current use of marijuana.  Caffeine: Endorses drinking 1 soda daily.  Tobacco: Denies history or current use of tobacco.  Illicits: Denies history or current use  of any additional illicit substances.    Allergies to Food, Medications, Products:  No Known Allergies    Past Medical, Surgical, Family History:  Past Medical History:   Diagnosis Date   ??? Anxiety    ??? Concussion     mild, age 20-20, fell on treadmill at gym   ??? Depression    ??? Mood swings    Non-psychiatric medications: None.  Most recent labs: Available labs reviewed from August 2018 through chart review.   Chronic illnesses: [name] reports a history of one concussion in 2016.  Surgeries: Denies any surgical procedures.     Family History   Problem Relation Age of Onset   ??? Alcohol abuse Mother    ??? Bipolar disorder Mother    ??? Depression Mother    ??? Anxiety disorder Sister    ??? OCD Sister    ??? Learning disabilities Sister      Social History:  Lives with /  Family: [name] recently moved back in with her parents, who are still married, in [place], [place]. She has one sister (age 60) with a history of OCD and nonverbal learning disorder who lives in [place]. She has one dog, cat, and rat. While she is sad to be leaving her friends behind in [place] she otherwise has not found the transition to be difficult and feels she made the right decision. Her parents have been very supportive through the transition as well.  Development / Childhood: Tasha Buckley grew up in [place] until age 9 and her family moved to [place]. When her father lost his job during her freshman year of college the family moved back to [place] where he was able to find employment again. Tasha Buckley denies any developmental delays or significant childhood stressors.  Education: [name] was attending the [employer]. This year she started her junior year studying human development and family studies. In the spring she intends to start online school through [employer] and to switch her focus to substance abuse counseling. She attended high school in [place].  Occupation: Over the summer [name]  worked at [employer] and while in college she worked at [employer]. She intends to find work as soon as possible.  Leisure: In her downtime [name] enjoys being with her friends, talking to her boyfriend, watching TV and movies (mostly crime dramas and documentaries), and would like to get into horseback riding.  Relationships: [name] has been in a relationship with her boyfriend since the summer of 2019; they met each other working at [employer]. His family lives here and he studies Lawyer in [place] state. [name] considers her college roommates to be her best friends.  Spiritual: Identifies as [employer]ri[employer] Knuckles; in the past [name] has been highly involved with the [employer] she was attending in [place].  Legal: Reports receiving a ticket for expired tags in 2018.  Military: Denies Financial planner.  Sexuality: Heterosexual and identifies with birth gender.  Trauma: [name] denies any history of sexual, physical, or emotional abuse. She denies any significant losses.  ??  Physical Review Of Systems:  Constitutional: Denies fatigue, malaise, fever, chills.  HEENT: Denies ear/eye pain or drainage, changes in vision or hearing, congestion or throat pain.  Respiratory: Denies difficulty breathing or chest pain, aside from when she experiences a panic attack.  Cardiovascular: Denies palpitations or irregular heart beats.  Gastrointestinal: Denies abd pain, N/V, diarrhea or constipation.  Genitourinary: Denies frequency/urgency or pain upon urination.  Integument/Breast: Denies abnormal lesions or changes in skin integrity.  Hematologic/Lymphatic: Denies clotting issues, increased bruising, swollen or tender lymph nodes.  Musculoskeletal: Denies muscle/joint pain or swelling.  Neurological: Denies headache, tremor, dizziness, change in consciousness.  ??  Objective:  Weight: 256.2 lbs (up 53 lbs since visit on 11/06/17).  Vital Signs:   There were no vitals filed for this visit.    Integument: No concerning moles or nevi. No areas of impaired skin integrity.  Musculoskeletal: Normal gait and station. Full ROM all extremities.  ??  Mental Status Exam:  General: Appropriately dressed, polite, cooperative, good eye contact. Appears stated age.  Speech: Normal rate, rhythm, articulations. Normal quality.  Motor: No atrophy or abnormal movements. Normal gait and station.  Mood/ Affect: Mood described as ???good???, affect??congruent.  Perception: Denies auditory/visual/tactile/olfactory hallucinations.  Thought Content: Denies delusions, paranoia.   Thought Process: Linear, goal-directed.  Memory: Short and long-term memory grossly intact as evidenced  by historical recall of verifiable facts from patient???s history.  Cognitive: Alert and oriented to all spheres, attention/concentration/focus intact and at patient???s baseline.  Safety:??Endorses passive??suicidal thoughts. Denies??intent, plan. Denies homicidal thoughts, intent plan. Denies plan for self-injury but endorses thoughts.  Insight/ Judgement:??Fair insight and limited judgement.  ??  ??  Diagnoses:  Axis I: Major depressive disorder, recurrent episode with mixed features (CMS Dx); rule out bipolar II  Axis II: Borderline personality disorder (CMS Dx)  Axis III: None  ??  Assessment/Plan:  1. Continue Celexa 40 mg. Decrease Seroquel by 50 mg every 5 to 7 days.??Discussed/reviewed mechanism of action of antipsychotics??(dopamine and for some also serotonin); and the purpose of this medication is to help manage mood and behavioral problems, and some for assistance with sleep. Side effects including but are not limited to sedation, orthostatic hypotension, dizziness, constipation, dry mouth, agitation, sexual side effects, glucose dysregulation, dyslipidemias, weight gain, increase in breast size or nipple discharge, hyperprolactinemia, drooling, lower the seizure threshold, cardiac arrhythmias, sudden cardiac death,??neuroleptic malignant syndrome  (although rare to occur and even more rare with second generation antipsychotics can occur and symptoms include but are not limited to: fever, confusion, rigidity, tachycardia, dysphagia),??extrapyramidal symptoms (side effects including but not limited to: parkinsonism symptoms including??tremor, balance problems, stiffness, slow movement, shuffle gait, ??akathisia or restlessness,??dystonia, and??tardive??dyskinesia. ??We have talked about that tardive dyskinesia (TD) possibly irreversible movements of my mouth, jaw, tongue, hands, feet, or body, discussed more likely to occur with older antipsychotic medicine and that it can occur spontaneously even when someone has never taken these medicines.??Continue Lamictal 150 mg daily. Discussed risks, benefits, and side effects with pt including but not limited to the mechanism of action of Lamictal and that it is an anticonvulsant/mood stabilizer that is used to treat symptoms of bipolar. Discussed side effects of sedation, dizziness, nausea, possibility of decreased efficacy of oral contraceptives, the importance of compliance, alcohol use, and skin reactions. Discussed with pt Stevens-Johnson Syndrome which is a severe adverse reaction of the skin and mucous membranes and requires immediate medical attention, symptoms often begins with fever and flu-like symptoms and then within a few days a skin rash appears, the skin may blister and peel usually starting on the face and the chest and then spreading. Discussed that Stevens-Johnson syndrome can be life threatening and if symptoms occur report to the emergency room immediately. Continue Metformin 500 mg twice daily with meals  2. Return to clinic in 3-4 weeks for follow-up or sooner if indicated  3. Continue outpatient therapy (Tasha Buckley recently started seeing a different provider at another facility)  4. Pt/family to call or e-mail with questions or concerns  5. Pt in agreement with this plan  6. Discussed exercise plan as well  as diet  Risk of Self Harm: Moderate  Risk of Harm to Others: Low  Assets/Strengths/Protective Factors: ability to engage, cognitive skills, creativity, prior successful treatment, sense of humor and suppportive network  Weaknesses/Limitations/Barriers to Treatment: lacks impulse control, lacks judgement and mental illness  Attitudes and Behaviors that require change: Affective instability/mood dysregulation, self-harming impulses/behaviors, maladaptive coping strategies  Decision making complexity: moderate to high; components considered and discussed include patient safety, differential diagnosis, previous medication trials, risks of medication complications, risk of symptom relapse, and medication side effects    Janace Litten, APRN-CNP  Psychiatric Mental-Health Nurse Practitioner   ??

## 2018-11-28 ENCOUNTER — Ambulatory Visit: Admit: 2018-11-28 | Discharge: 2018-12-03 | Payer: PRIVATE HEALTH INSURANCE | Attending: Psychiatric/Mental Health

## 2018-11-28 DIAGNOSIS — F339 Major depressive disorder, recurrent, unspecified: Secondary | ICD-10-CM

## 2018-11-28 MED ORDER — QUEtiapine (SEROQUEL) 100 MG tablet
100 | ORAL_TABLET | Freq: Every evening | ORAL | 1 refills | Status: AC
Start: 2018-11-28 — End: 2019-01-01

## 2018-11-28 MED ORDER — lamoTRIgine (LAMICTAL) 200 MG tablet
200 | ORAL_TABLET | Freq: Every day | ORAL | 1 refills | Status: AC
Start: 2018-11-28 — End: 2019-01-01

## 2018-11-28 NOTE — Unmapped (Addendum)
[place]   [facility]  Outpatient Follow-Up Appointment      Name: Tasha Buckley  Date: 11/28/2018  Duration: This was a phone conversation on 11/28/2018 in lieu of an in-person visit due to coronavirus emergency. Patient verified that they were at their home in [place], [place] during session. Patient provided verbal consent for an over the phone visit. I spent 20 to 24 minutes on this call conducting an interview, performing a limited exam by phone, and educating the patient on my assessment and plan. No family members were present on the call. [name] was spoken to for pharmacological management and monitoring of depression, anxiety and irritability.  Psychotherapy add on: N/A. Tasha Buckley continues to participate actively in treatment and??progress is being made towards the goal of managing her depression, anxiety, and irritability.??Tasha Buckley??remains safe for outpatient with minimal risks.  Billing Code: [id number] TEL    Chief Complaint: My mom made me schedule this appointment.     HPI: Tasha Buckley sounded calm and relaxed during today's phone session. This visit was intended to be in person but Tasha Buckley stayed home due a possible COVID-19 exposure. Over the past week she's had a friend staying with her from out of town who recently developed symptoms. Tasha Buckley, her family, and her friend are all pending test results and staying quarantined at home. When asked how she's doing Tasha Buckley stated my mom is being dramatic about my mood. Since decreasing her Seroquel (she is now down to 50 mg) Tasha Buckley has notices worsening mood, more difficulty falling asleep at night, depressive symptoms, and general apathy. Last week she had no interest in going to work and decided to call off. She expressed feeling thankful for the COVID-19 exposure because she doesn't have to go anywhere or do anything. Tasha Buckley denied any safety concerns or thoughts of suicide. In addition to worsening mood she has been  fighting with her school about having to pay tuition for classes she dropped when she was suicidal last year and decided to come home. Tasha Buckley is unsure of what she wants to do moving forward with her medication regimen, especially due to adverse side effects she's experienced with other antipsychotics in the past. Discussed increasing Lamictal to 200 mg and continuing Seroquel 100 mg at bedtime for the time being and re-evaluating in 1-2 months. Tasha Buckley was in agreement with this plan and intends to call if her mood continues to worsen or if there are any safety concerns. No additional questions or concerns at this time.    Current Outpatient Medications   Medication Sig   ??? citalopram Take 1 tablet (40 mg total) by mouth daily.   ??? lamoTRIgine Take 1 tablet (200 mg total) by mouth daily. Indications: MOOD   ??? metFORMIN Take 1 tablet (500 mg total) by mouth 2 times a day with meals.   ??? QUEtiapine Take 1 tablet (100 mg total) by mouth at bedtime. Indications: CALM MIND, MOOD     No current facility-administered medications for this visit.      Original Presentation to Outpatient Setting (11/06/17):  HPI: The client is a 23 year old Caucasian female with a history of anxiety and depression. [name] (prefers the name Tasha Buckley) was calm, cooperative, and open to the therapeutic process during session. Tasha Buckley started the session by discussing that college has always been difficult for her but this semester she completely lost it and made the decision to come home. The previous semester she had to come home early due to her  anxiety and during the summer between her freshman and sophomore year she was hospitalized for suicidal ideation. She struggled to identify a trigger for her recent meltdown; she was sitting on her couch and starting looking up jobs that she could get without a college degree and started hyperventilating and called her mother to tell her she wanted to drop out. Since the beginning of this  semester she has struggled with the motivation to get up and go to class and she was unable to remember the last time where she had a week of consistent attendance. She described her moods as fluctuating; when she is with her roommates being social she feels great but when she is alone her thoughts race and she feels depressed. At times she's felt like she's on autopilot. She denies any changes in her appetite or sleep schedule. She discussed that she loves sleep and on days when she feels depressed she will sometimes take over-the-counter sleep aids during the day so she can relax and sleep through it.  ??  When discussing symptoms of depression Tasha Buckley endorsed sad moods, anhedonia, hypersomnia, difficulty getting out of bed in the morning, decreased energy and concentration, crying spells, passive thoughts of death, poor self-care, low self-esteem, and occasional worsening of her mood when she gets her period. She experiences meltdowns or panic attacks every few months that present with difficulty breathing, racing heart, extreme anxiety, and heightened sensitivity where she will snap at someone. She often feels overwhelmed, restless, and anxious to be around people and meeting new people. Tasha Buckley discussed that she has a family history of bipolar and endorsed several symptoms of mania including distractibility, some difficulty with sleep, increased goal directed activity (like starting a new diet or exercise plan that she never follows through on), flight of ideas, impulsive choices (buying a rat without any previous thought, dyeing her hair 3 times a month, going out late at night for food, seeking adventures, and once going into an abandoned building with a friend), irritability, and anger outbursts. She struggled to identify a timeline or if these were happening during distinct episodes of time. She feels that she has poor impulses and judgement most of the time. She denies that these symptoms may  have occurred for 7 days at a time but thinks it's possible that they have occurred 4 days at a time.  ??  Tasha Buckley endorsed some symptoms of ADHD including poor attention to detail, making careless mistakes on her homework or not turning things in, difficulty listening and sustaining attention unless she's very interested in the topic, inability to organize, often losing things, and difficulty sitting still. She denies ever being diagnosed with anorexia or bulimia but that she does have a history of horrible body image and some disordered eating by skipping occasional meals. She denies symptoms of PTSD, OCD, psychosis, paranoia, or delusional thinking. When discussing goals for treatment Tasha Buckley verbalized that she wants to get back on track by having her medications adjusted, start with a new therapist, get everything lined up for online school next year, and find a new job.    Past Psychiatric History:  Previous inpatient admission: Tasha Buckley was hospitalized once at [facility] from 08/29/16 to 08/31/16 for suicidal ideation and superficial cutting; the admission was triggered by being told by peers that she looked like she had lost weight but when she weighed herself she saw that she actually hadn't lost any weight. She denies any hospitalizations since then. She sought care this month from [facility] but her family ultimately decided not to pursue due to financial reasons. Tasha Buckley attended [facility] in April of 2020  Previous history of violence to self or others: Tasha Buckley reports a brief history of self-harm by cutting; she started cutting superficially 2 weeks prior to her inpatient admission in 2018. She sometimes cuts now with the most recent episode being a few months ago. She discussed passive suicidal ideation to drive her car into a pole or overdose on medications but denies any intent. She denies any homicidal ideation or access to firearms.  Protective factors/strengths: Protective factors include her  family, friends, and boyfriend. She discussed that after watching '13 Reasons Why' she thought of suicide as almost selfish, and the aftermath for the ones you care about is horrible. Strengths include her ability to listen to others and be compassionate.  Past/Current Outpatient Treatment: Tasha Buckley was seeing a Publishing rights manager at [facility] [place] [place] for medication management as well as a therapist.   Past Psychiatric Medication Trials: During her inpatient admission in August of 2018 she was prescribed Wellbutrin XL 150 mg as an augmentation to her Celexa but she stopped taking it a year ago due to no longer finding it helpful. She was prescribed Abilify in the past and found the side effects to be horrible including drooling, akathisia, and some difficulty breathing. She has noticed similar symptoms with the Seroquel (mainly akathisia) but has not found it to be as severe. She denies any additional medication trials.    Substance Use History:  ETOH: Denies regular alcohol use. Tasha Buckley discussed that this was a pretty big issue in high school and that she'd been abstaining from alcohol for the past few years. She drank twice in the last month but required 10+ drinks due to her high tolerance.  MJ: Denies history or current use of marijuana.  Caffeine: Endorses drinking 1 soda daily.  Tobacco: Denies history or current use of tobacco.  Illicits: Denies history or current use of any additional illicit substances.    Allergies to Food, Medications, Products:  No Known Allergies    Past Medical, Surgical, Family History:  Past Medical History:   Diagnosis Date   ??? Anxiety    ??? Concussion     mild, age 4-18, fell on treadmill at gym   ??? Depression    ??? Mood swings    Non-psychiatric medications: None.  Most recent labs: Last labs completed were ordered by this clinician in 2019.  Chronic illnesses: Tasha Buckley reports a history of one concussion in 2016.  Surgeries: Denies any surgical  procedures.     Family History   Problem Relation Age of Onset   ??? Alcohol abuse Mother    ??? Bipolar disorder Mother    ??? Depression Mother    ??? Anxiety disorder Sister    ??? OCD Sister    ??? Learning disabilities Sister      Social History:  Lives with / Family: Tasha Buckley recently moved back in with her parents, who are still married, in [place], [place]. She has one sister (age 41) with a history of OCD and nonverbal learning disorder who lives in [place]. She has one dog, cat, and rat. While she is sad to be leaving her friends behind in [place] she otherwise has not found the transition to be difficult and feels she made the right decision. Her parents have been very supportive through the transition as well.  Development / Childhood: Tasha Buckley grew up in  [place] until age 26 and her family moved to [place]. When her father lost his job during her freshman year of college the family moved back to [place] where he was able to find employment again. Tasha Buckley denies any developmental delays or significant childhood stressors.  Education: Tasha Buckley was attending the [employer], [place]. This year she started her junior year studying human development and family studies. In the spring she intends to start online school through [employer] and to switch her focus to substance abuse counseling. She attended high school in [place].  Occupation: Over the summer Tasha Buckley worked at [employer] and while in college she worked at [employer]. She intends to find work as soon as possible.  Leisure: In her downtime Tasha Buckley enjoys being with her friends, talking to her boyfriend, watching TV and movies (mostly crime dramas and documentaries), and would like to get into horseback riding.  Relationships: Tasha Buckley has been in a relationship with her boyfriend since the summer of 2019; they met each other working at [employer]. His family lives here and he studies Lawyer in  [place] state. Tasha Buckley considers her college roommates to be her best friends.  Spiritual: Identifies as [employer] Kn[employer]; in the past Tasha Buckley has been highly involved with the [employer] she was attending in [place].  Legal: Reports receiving a ticket for expired tags in 2018.  Military: Denies Financial planner.  Sexuality: Heterosexual and identifies with birth gender.  Trauma: Tasha Buckley denies any history of sexual, physical, or emotional abuse. She denies any significant losses.  ??  Physical Review Of Systems:  Constitutional: Denies fatigue, malaise, fever, chills.  HEENT: Denies ear/eye pain or drainage, changes in vision or hearing, congestion or throat pain.  Respiratory: Denies difficulty breathing or chest pain, aside from when she experiences a panic attack.  Cardiovascular: Denies palpitations or irregular heart beats.  Gastrointestinal: Denies abd pain, N/V, diarrhea or constipation.  Genitourinary: Denies frequency/urgency or pain upon urination.  Integument/Breast: Denies abnormal lesions or changes in skin integrity.  Hematologic/Lymphatic: Denies clotting issues, increased bruising, swollen or tender lymph nodes.  Musculoskeletal: Denies muscle/joint pain or swelling.  Neurological: Denies headache, tremor, dizziness, change in consciousness.  ??  Objective:  Weight: 256.2 lbs (up 53 lbs since visit on 11/06/17).  Vital Signs:   There were no vitals filed for this visit.   ??  Mental Status Exam:  General: Unable to assess; phone session.  Speech: Normal rate, rhythm, articulations. Normal quality.  Motor: Unable to assess; phone session.  Mood/ Affect: Mood described as ???fine???, unable to assess affect.  Perception: Denies auditory/visual/tactile/olfactory hallucinations.  Thought Content: Denies delusions, paranoia.   Thought Process: Linear, goal-directed.  Memory: Short and long-term memory grossly intact as evidenced by historical recall of verifiable facts from patient???s  history.  Cognitive: Alert and oriented to all spheres, attention/concentration/focus intact and at patient???s baseline.  Safety:??Endorses passive??suicidal thoughts. Denies??intent, plan. Denies homicidal thoughts, intent plan. Denies plan for self-injury but endorses thoughts.  Insight/ Judgement:??Fair insight and limited judgement.  ??  Diagnoses:  Axis I: Major depressive disorder, recurrent episode with mixed features (CMS Dx); rule out bipolar II  Axis II: Borderline personality disorder (CMS Dx)  Axis III: None  ??  Assessment/Plan:  1. Continue Celexa 40 mg. Continue Seroquel 100 mg for the time being due to worsening mood.??Discussed/reviewed mechanism of action of antipsychotics??(dopamine and for some also serotonin); and the purpose of this medication is to help  manage mood and behavioral problems, and some for assistance with sleep. Side effects including but are not limited to sedation, orthostatic hypotension, dizziness, constipation, dry mouth, agitation, sexual side effects, glucose dysregulation, dyslipidemias, weight gain, increase in breast size or nipple discharge, hyperprolactinemia, drooling, lower the seizure threshold, cardiac arrhythmias, sudden cardiac death,??neuroleptic malignant syndrome (although rare to occur and even more rare with second generation antipsychotics can occur and symptoms include but are not limited to: fever, confusion, rigidity, tachycardia, dysphagia),??extrapyramidal symptoms (side effects including but not limited to: parkinsonism symptoms including??tremor, balance problems, stiffness, slow movement, shuffle gait, ??akathisia or restlessness,??dystonia, and??tardive??dyskinesia. ??We have talked about that tardive dyskinesia (TD) possibly irreversible movements of my mouth, jaw, tongue, hands, feet, or body, discussed more likely to occur with older antipsychotic medicine and that it can occur spontaneously even when someone has never taken these medicines.??Increase Lamictal to  200 mg daily. Discussed risks, benefits, and side effects with pt including but not limited to the mechanism of action of Lamictal and that it is an anticonvulsant/mood stabilizer that is used to treat symptoms of bipolar. Discussed side effects of sedation, dizziness, nausea, possibility of decreased efficacy of oral contraceptives, the importance of compliance, alcohol use, and skin reactions. Discussed with pt Stevens-Johnson Syndrome which is a severe adverse reaction of the skin and mucous membranes and requires immediate medical attention, symptoms often begins with fever and flu-like symptoms and then within a few days a skin rash appears, the skin may blister and peel usually starting on the face and the chest and then spreading. Discussed that Stevens-Johnson syndrome can be life threatening and if symptoms occur report to the emergency room immediately. Continue Metformin 500 mg twice daily with meals  2. Return to clinic in 4-8 weeks for follow-up or sooner if indicated  3. Continue outpatient therapy (Tasha Buckley recently started seeing a different provider at another facility)  4. Pt/family to call or e-mail with questions or concerns  5. Pt in agreement with this plan  6. Discussed exercise plan as well as diet  Risk of Self Harm: Moderate  Risk of Harm to Others: Low  Assets/Strengths/Protective Factors: ability to engage, cognitive skills, creativity, prior successful treatment, sense of humor and suppportive network  Weaknesses/Limitations/Barriers to Treatment: lacks impulse control, lacks judgement and mental illness  Attitudes and Behaviors that require change: Affective instability/mood dysregulation, self-harming impulses/behaviors, maladaptive coping strategies  Decision making complexity: moderate to high; components considered and discussed include patient safety, differential diagnosis, previous medication trials, risks of medication complications, risk of symptom relapse, and medication side  effects    Janace Litten, APRN-CNP  Psychiatric Mental-Health Nurse Practitioner   ??

## 2019-01-01 ENCOUNTER — Ambulatory Visit: Admit: 2019-01-01 | Payer: PRIVATE HEALTH INSURANCE | Attending: Psychiatric/Mental Health

## 2019-01-01 ENCOUNTER — Ambulatory Visit: Payer: PRIVATE HEALTH INSURANCE | Attending: Psychiatric/Mental Health

## 2019-01-01 DIAGNOSIS — F339 Major depressive disorder, recurrent, unspecified: Secondary | ICD-10-CM

## 2019-01-01 MED ORDER — lamoTRIgine (LAMICTAL) 25 MG tablet
25 | ORAL_TABLET | ORAL | 0 refills | Status: AC
Start: 2019-01-01 — End: 2019-01-14

## 2019-01-01 MED ORDER — lamoTRIgine (LAMICTAL) 100 MG tablet
100 | ORAL_TABLET | ORAL | 0 refills | Status: AC
Start: 2019-01-01 — End: 2019-01-09

## 2019-01-01 MED ORDER — QUEtiapine (SEROQUEL) 200 MG tablet
200 | ORAL_TABLET | Freq: Every evening | ORAL | 0 refills | Status: AC
Start: 2019-01-01 — End: 2019-03-17

## 2019-01-01 MED ORDER — topiramate (TOPAMAX) 25 MG tablet
25 | ORAL_TABLET | Freq: Two times a day (BID) | ORAL | 0 refills | Status: AC
Start: 2019-01-01 — End: 2019-02-05

## 2019-01-01 NOTE — Unmapped (Signed)
OP Treatment Plan    Tasha Buckley  [id number]    [facility] TREATMENT PLAN:   Are there any changes to the Treatment Plan?:  Yes, see noted changes in treatment plan.  General Goals:  Patient will be able to recognize, accept, and manage his/her mental health and/or substance misuse disorders, including working with the medical staff to manage his/her medications. Improve symptoms/maintain symptom improvement. Reduce risk of hospital stays and Avoid/Prevent hospitalization. Reduce risk of suicide attempts/no active suicide attempts. Patient will report at least a 50% improvement in intensity and frequency of his/her symptoms. Patient will report improved ability to attend to usual functions and roles. Improve functioning/maintain functional improvement. Patient will verbalize insight into the need and importance of receiving care for their symptoms. Patient will work towards gainful employment. Patient will report stabilization of body weight and image.  Med/Som Specific:  Patient will report all signs and symptoms to the MD/NP. Patient will attend all scheduled appointments with MD/NP. Patient will engage in an informed consent discussion regarding medications for the treatment of their symptoms. Patient will report any side effect issues related to the medications. Patient will demonstrate adherence to the medication treatment that is established. Patient will agree to have MD/NP collaborate with other health professional as needed to ensure optimal care.  Therapy Specific:  Patient will attend and be an active participant in individual therapy.  Expected Frequency of Visits:  Q 8 weeks/Ongoing  Prognosis:  Good

## 2019-01-01 NOTE — Unmapped (Signed)
[place]   [facility]  Outpatient Follow-Up Appointment      Name: Tasha Buckley  Date: 01/01/2019  Duration: This was a video (Doximity) conversation on 01/01/2019 in lieu of an in-person visit due to coronavirus emergency. Patient verified that they were at their home in [place], [place] during session. Patient provided verbal consent for a video the phone visit. I spent 20 to 24 minutes on this call conducting an interview, performing a limited exam by video, and educating the patient on my assessment and plan. Tasha Buckley's mother was present for the entirety of the call. Tasha Buckley was spoken to for pharmacological management and monitoring of depression, anxiety and irritability.  Psychotherapy add on: N/A. Tasha Buckley continues to participate actively in treatment and??progress is being made towards the goal of managing her depression, anxiety, and irritability.??Tasha Buckley??remains safe for outpatient with minimal risks.  Billing Code: 11914 TEL    Chief Complaint: I haven't been very happy lately.     HPI: Tasha Buckley appeared calm and relaxed during today's session and was frequently distracted by her cats that were walking in and out of frame. She discussed that she hasn't been very happy lately and her parents have been concerned. When asked about what's been going on she discussed that she became very upset last week about a cat she couldn't save, she had an argument with her family after looking at Christmas lights a few days ago, and she's been hating going to work every day. Tasha Buckley's mother confirmed that her mood has been in a worse place recently and she and her husband have been concerned she may do something impulsive. She discussed that Tasha Buckley is irritable every day when she comes home and she sleeps too much. Tasha Buckley did endorse self-harm urges after the fight with her family but was able to keep herself safe. She tried to explain that she doesn't necessarily want to die  but isn't enjoying life. The beginning of the pandemic was very stressful for [facility] but she denied that the worsening cases in the community could be impacting her mood since it hasn't changed her life whatsoever. She has been making a good effort to stay social by participating in a church group every week, watching Vampire Diaries with her sister, and visiting friends in [place] over the break.    This clinician expressed concern that Tasha Buckley's mood had been stable until decreasing her Seroquel dose. While she was in agreement with this she expressed hesitance about increasing the dose again due to weight gain concerns, especially considering she hasn't been compliant with Metformin due to the side effects. Tasha Buckley's mother encouraged her that her mental health is more important than weight gain and reminded her about negative side effects she had on other atypical antipsychotics. Tasha Buckley's mother herself takes Seroquel for management of bipolar symptoms and has been stable for several years; she also takes Topamax as an adjunct and for concerns over weight gain and this has been helpful. Tasha Buckley was open to the idea of increasing Seroquel again, coming off of Lamictal, and starting Topamax instead. Discussed intention to follow up in 3-4 weeks. No additional questions or concerns at this time.    Current Outpatient Medications   Medication Sig   ??? citalopram Take 1 tablet (40 mg total) by mouth daily.   ??? lamoTRIgine Take 1.5 tablets (150 mg total) by mouth daily. After 4 days decrease to 1 tablet (100 mg total) by mouth daily.   ??? [START  ON 01/09/2019] lamoTRIgine Take 2 tablets (50 mg total) by mouth daily. After 3 days decrease to 1 tablet (25 mg total) by mouth daily.   ??? metFORMIN Take 1 tablet (500 mg total) by mouth 2 times a day with meals.   ??? QUEtiapine Take 1 tablet (200 mg total) by mouth at bedtime. In 1 week increase to 1.5 tablets (300 mg total).   ??? topiramate Take 1 tablet (25 mg  total) by mouth 2 times a day.     No current facility-administered medications for this visit.      Original Presentation to Outpatient Setting (11/06/17):  HPI: The client is a 23 year old Caucasian female with a history of anxiety and depression. Tasha Buckley (prefers the name Tasha Buckley) was calm, cooperative, and open to the therapeutic process during session. Tasha Buckley started the session by discussing that college has always been difficult for her but this semester she completely lost it and made the decision to come home. The previous semester she had to come home early due to her anxiety and during the summer between her freshman and sophomore year she was hospitalized for suicidal ideation. She struggled to identify a trigger for her recent meltdown; she was sitting on her couch and starting looking up jobs that she could get without a college degree and started hyperventilating and called her mother to tell her she wanted to drop out. Since the beginning of this semester she has struggled with the motivation to get up and go to class and she was unable to remember the last time where she had a week of consistent attendance. She described her moods as fluctuating; when she is with her roommates being social she feels great but when she is alone her thoughts race and she feels depressed. At times she's felt like she's on autopilot. She denies any changes in her appetite or sleep schedule. She discussed that she loves sleep and on days when she feels depressed she will sometimes take over-the-counter sleep aids during the day so she can relax and sleep through it.  ??  When discussing symptoms of depression Tasha Buckley endorsed sad moods, anhedonia, hypersomnia, difficulty getting out of bed in the morning, decreased energy and concentration, crying spells, passive thoughts of death, poor self-care, low self-esteem, and occasional worsening of her mood when she gets her period. She experiences meltdowns or  panic attacks every few months that present with difficulty breathing, racing heart, extreme anxiety, and heightened sensitivity where she will snap at someone. She often feels overwhelmed, restless, and anxious to be around people and meeting new people. Tasha Buckley discussed that she has a family history of bipolar and endorsed several symptoms of mania including distractibility, some difficulty with sleep, increased goal directed activity (like starting a new diet or exercise plan that she never follows through on), flight of ideas, impulsive choices (buying a rat without any previous thought, dyeing her hair 3 times a month, going out late at night for food, seeking adventures, and once going into an abandoned building with a friend), irritability, and anger outbursts. She struggled to identify a timeline or if these were happening during distinct episodes of time. She feels that she has poor impulses and judgement most of the time. She denies that these symptoms may have occurred for 7 days at a time but thinks it's possible that they have occurred 4 days at a time.  ??  Tasha Buckley endorsed some symptoms of ADHD including poor attention to detail, making careless mistakes on her  homework or not turning things in, difficulty listening and sustaining attention unless she's very interested in the topic, inability to organize, often losing things, and difficulty sitting still. She denies ever being diagnosed with anorexia or bulimia but that she does have a history of horrible body image and some disordered eating by skipping occasional meals. She denies symptoms of PTSD, OCD, psychosis, paranoia, or delusional thinking. When discussing goals for treatment Tasha Buckley verbalized that she wants to get back on track by having her medications adjusted, start with a new therapist, get everything lined up for online school next year, and find a new job.    Past Psychiatric History:  Previous inpatient admission: Tasha Buckley was  hospitalized once at [facility] from 08/29/16 to 08/31/16 for suicidal ideation and superficial cutting; the admission was triggered by being told by peers that she looked like she had lost weight but when she weighed herself she saw that she actually hadn't lost any weight. She denies any hospitalizations since then. She sought care this month from [facility] but her family ultimately decided not to pursue due to financial reasons. Tasha Buckley attended [facility] in April of 2020  Previous history of violence to self or others: Tasha Buckley reports a brief history of self-harm by cutting; she started cutting superficially 2 weeks prior to her inpatient admission in 2018. She sometimes cuts now with the most recent episode being a few months ago. She discussed passive suicidal ideation to drive her car into a pole or overdose on medications but denies any intent. She denies any homicidal ideation or access to firearms.  Protective factors/strengths: Protective factors include her family, friends, and boyfriend. She discussed that after watching '13 Reasons Why' she thought of suicide as almost selfish, and the aftermath for the ones you care about is horrible. Strengths include her ability to listen to others and be compassionate.  Past/Current Outpatient Treatment: Tasha Buckley was seeing a Publishing rights manager at [facility] [place] [place] for medication management as well as a therapist.   Past Psychiatric Medication Trials: During her inpatient admission in August of 2018 she was prescribed Wellbutrin XL 150 mg as an augmentation to her Celexa but she stopped taking it a year ago due to no longer finding it helpful. She was prescribed Abilify in the past and found the side effects to be horrible including drooling, akathisia, and some difficulty breathing. She has noticed similar symptoms with the Seroquel (mainly akathisia) but has not found it to be as severe. She denies any additional medication  trials.    Substance Use History:  ETOH: Denies regular alcohol use. Tasha Buckley discussed that this was a pretty big issue in high school and that she'd been abstaining from alcohol for the past few years. She drank twice in the last month but required 10+ drinks due to her high tolerance.  MJ: Denies history or current use of marijuana.  Caffeine: Endorses drinking 1 soda daily.  Tobacco: Denies history or current use of tobacco.  Illicits: Denies history or current use of any additional illicit substances.    Allergies to Food, Medications, Products:  No Known Allergies    Past Medical, Surgical, Family History:  Past Medical History:   Diagnosis Date   ??? Anxiety    ??? Concussion     mild, age [age], fell on treadmill at gym   ??? Depression    ??? Mood swings    Non-psychiatric medications: None.  Most recent labs: Last labs completed were ordered by this  clinician in 2019.  Chronic illnesses: Tasha Buckley reports a history of one concussion in 2016.  Surgeries: Denies any surgical procedures.     Family History   Problem Relation Age of Onset   ??? Alcohol abuse Mother    ??? Bipolar disorder Mother    ??? Depression Mother    ??? Anxiety disorder Sister    ??? OCD Sister    ??? Learning disabilities Sister      Social History:  Lives with / Family: Tasha Buckley recently moved back in with her parents, who are still married, in [place], [place]. She has one sister (age 58) with a history of OCD and nonverbal learning disorder who lives in [place]. She has one dog, cat, and rat. While she is sad to be leaving her friends behind in [place] she otherwise has not found the transition to be difficult and feels she made the right decision. Her parents have been very supportive through the transition as well.  Development / Childhood: Tasha Buckley grew up in [place] until age 54 and her family moved to [place]. When her father lost his job during her freshman year of college the family moved back to [place] where he was able to find  employment again. Tasha Buckley denies any developmental delays or significant childhood stressors.  Education: Tasha Buckley was attending the [employer], [place]. This year she started her junior year studying human development and family studies. In the spring she intends to start online school through [employer] and to switch her focus to substance abuse counseling. She attended high school in [place].  Occupation: Currently employed in a school setting.  Leisure: In her downtime Tasha Buckley enjoys being with her friends, talking to her boyfriend, watching TV and movies (mostly crime dramas and documentaries), and would like to get into horseback riding.  Relationships: Tasha Buckley has been in a relationship with her boyfriend since the summer of 2019; they met each other working at [facility]. His family lives here and he studies Lawyer in [place] state. Tasha Buckley considers her college roommates to be her best friends.  Spiritual: Identifies as [employer]; in the past Tasha Buckley has been highly involved with the [employer] she was attending in [place].  Legal: Reports receiving a ticket for expired tags in 2018.  Military: Denies Financial planner.  Sexuality: Heterosexual and identifies with birth gender.  Trauma: Tasha Buckley denies any history of sexual, physical, or emotional abuse. She denies any significant losses.  ??  Physical Review Of Systems:  Constitutional: Denies fatigue, malaise, fever, chills.  HEENT: Denies ear/eye pain or drainage, changes in vision or hearing, congestion or throat pain.  Respiratory: Denies difficulty breathing or chest pain, aside from when she experiences a panic attack.  Cardiovascular: Denies palpitations or irregular heart beats.  Gastrointestinal: Denies abd pain, N/V, diarrhea or constipation.  Genitourinary: Denies frequency/urgency or pain upon urination.  Integument/Breast: Denies abnormal lesions or changes in skin  integrity.  Hematologic/Lymphatic: Denies clotting issues, increased bruising, swollen or tender lymph nodes.  Musculoskeletal: Denies muscle/joint pain or swelling.  Neurological: Denies headache, tremor, dizziness, change in consciousness.  ??  Objective:  Weight: 256.2 lbs (up 53 lbs since visit on 11/06/17).  Vital Signs:   There were no vitals filed for this visit.   Integument:??No concerning moles or nevi. No areas of impaired skin integrity.  Musculoskeletal: Normal gait and station. Full ROM all extremities.  ??  Mental Status Exam:  General: Appropriately dressed,  polite, cooperative,??good??eye contact. Appears stated age.  Speech: Normal rate, rhythm, articulations. Normal quality.  Motor: No atrophy or abnormal movements. Normal gait and station.  Mood/ Affect: Mood described as ???unhappy???, affect??congruent.  Perception: Denies auditory/visual/tactile/olfactory hallucinations.  Thought Content: Denies delusions, paranoia.   Thought Process: Linear, goal-directed.  Memory: Short and long-term memory grossly intact as evidenced by historical recall of verifiable facts from patient???s history.  Cognitive: Alert and oriented to all spheres, attention/concentration/focus intact and at patient???s baseline.  Safety:??Endorses passive??suicidal thoughts. Denies??intent, plan. Denies homicidal thoughts, intent plan. Denies plan for self-injury but endorses thoughts.  Insight/ Judgement:??Limited insight and limited judgement.  ??  Diagnoses:  Axis I: Major depressive disorder, recurrent episode with mixed features (CMS Dx); rule out bipolar II  Axis II: Borderline personality disorder (CMS Dx)  Axis III: None  ??  Assessment/Plan:  1. Continue Celexa 40 mg. Increase Seroquel to 200 mg for one week and then again to 300 mg worsening mood.??Discussed/reviewed mechanism of action of antipsychotics??(dopamine and for some also serotonin); and the purpose of this medication is to help manage mood and behavioral problems, and some for  assistance with sleep. Side effects including but are not limited to sedation, orthostatic hypotension, dizziness, constipation, dry mouth, agitation, sexual side effects, glucose dysregulation, dyslipidemias, weight gain, increase in breast size or nipple discharge, hyperprolactinemia, drooling, lower the seizure threshold, cardiac arrhythmias, sudden cardiac death,??neuroleptic malignant syndrome (although rare to occur and even more rare with second generation antipsychotics can occur and symptoms include but are not limited to: fever, confusion, rigidity, tachycardia, dysphagia),??extrapyramidal symptoms (side effects including but not limited to: parkinsonism symptoms including??tremor, balance problems, stiffness, slow movement, shuffle gait, ??akathisia or restlessness,??dystonia, and??tardive??dyskinesia. ??We have talked about that tardive dyskinesia (TD) possibly irreversible movements of my mouth, jaw, tongue, hands, feet, or body, discussed more likely to occur with older antipsychotic medicine and that it can occur spontaneously even when someone has never taken these medicines.??Provided detailed tapering plan by e-mail to come off of Lamictal and start Topamax; reviewed risks, benefits, and side effects. Pt has not been compliant with Metformin 500 mg twice daily with meals  2. Return to clinic in 3-4 weeks for follow-up or sooner if indicated  3. Continue outpatient therapy bi-weekly (Tasha Buckley recently started seeing a different provider at another facility)  4. Pt/family to call or e-mail with questions or concerns  5. Pt in agreement with this plan  6. Discussed exercise plan as well as diet  Risk of Self Harm: Moderate  Risk of Harm to Others: Low  Assets/Strengths/Protective Factors: ability to engage, cognitive skills, creativity, prior successful treatment, sense of humor and suppportive network  Weaknesses/Limitations/Barriers to Treatment: lacks impulse control, lacks judgement and mental  illness  Attitudes and Behaviors that require change: Affective instability/mood dysregulation, self-harming impulses/behaviors, maladaptive coping strategies  Decision making complexity: moderate to high; components considered and discussed include patient safety, differential diagnosis, previous medication trials, risks of medication complications, risk of symptom relapse, and medication side effects    Janace Litten, APRN-CNP  Psychiatric Mental-Health Nurse Practitioner   ??

## 2019-02-05 MED ORDER — topiramate (TOPAMAX) 25 MG tablet
25 | ORAL_TABLET | ORAL | 0 refills | Status: AC
Start: 2019-02-05 — End: 2019-03-17

## 2019-03-17 MED ORDER — QUEtiapine (SEROQUEL) 200 MG tablet
200 | ORAL_TABLET | Freq: Every evening | ORAL | 0 refills | Status: AC
Start: 2019-03-17 — End: 2019-04-27

## 2019-03-17 MED ORDER — topiramate (TOPAMAX) 25 MG tablet
25 | ORAL_TABLET | Freq: Two times a day (BID) | ORAL | 0 refills | Status: AC
Start: 2019-03-17 — End: 2019-04-16

## 2019-04-16 MED ORDER — topiramate (TOPAMAX) 25 MG tablet
25 | ORAL_TABLET | ORAL | 0 refills | Status: AC
Start: 2019-04-16 — End: 2019-04-27

## 2019-04-16 NOTE — Telephone Encounter (Signed)
Refill sent--please schedule appt--thanks

## 2019-04-20 ENCOUNTER — Telehealth: Payer: PRIVATE HEALTH INSURANCE | Attending: Psychiatric/Mental Health

## 2019-04-20 NOTE — Progress Notes (Signed)
No show for scheduled tele-health appointment. Clinician waited 20+ minutes on MyChart and called and left a voicemail with no return response.

## 2019-04-27 ENCOUNTER — Ambulatory Visit: Admit: 2019-04-27 | Payer: PRIVATE HEALTH INSURANCE | Attending: Psychiatric/Mental Health

## 2019-04-27 DIAGNOSIS — F339 Major depressive disorder, recurrent, unspecified: Secondary | ICD-10-CM

## 2019-04-27 MED ORDER — QUEtiapine (SEROQUEL) 300 MG tablet
300 | ORAL_TABLET | Freq: Every evening | ORAL | 2 refills | Status: AC
Start: 2019-04-27 — End: 2019-09-08

## 2019-04-27 MED ORDER — citalopram (CELEXA) 40 MG tablet
40 | ORAL_TABLET | Freq: Every day | ORAL | 2 refills | Status: AC
Start: 2019-04-27 — End: 2019-08-13

## 2019-04-27 MED ORDER — topiramate (TOPAMAX) 25 MG tablet
25 | ORAL_TABLET | Freq: Two times a day (BID) | ORAL | 2 refills | Status: AC
Start: 2019-04-27 — End: 2019-09-08

## 2019-04-27 NOTE — Unmapped (Addendum)
[place]   [facility]  Outpatient Follow-Up Appointment      Name: Tasha Buckley  Date: 04/27/2019  Duration: This was a phone conversation on 04/27/2019 in lieu of an in-person visit due to coronavirus emergency. Patient verified that they were at their home in [place], [place] during session. Patient/guardian provided verbal consent for a phone visit. No family members were present on the call. Tasha Buckley was spoken to for pharmacological management and monitoring of depression, anxiety and irritability. I spent 10 minutes talking with the patient, conducting an interview, performing a limited exam, and educating the patient on my assessment and plan. I also spent 20 minutes on the same day as the encounter, ordering medications, tests, or procedures and documenting clinical information in the electronic or other health record.   Psychotherapy add on: N/A. Tasha Buckley continues to participate actively in treatment and progress is being made towards the goal of managing her depression, anxiety, and irritability. Tasha Buckley remains safe for outpatient with moderate risks.  Billing Code: 16109 TEL    Number and Complexity of Problems (Level 4)  During this encounter, I addressed 2 or more stable chronic illnesses.    Amount and/or Complexity of Data Ordered, Reviewed, or Analyzed (Level)  I reviewed external note(s) from: N/A.    Risk of Complication and/or Morbidity or Mortality of Patient Management (Level  4)  The patient's management entails Moderate risk of complications and/or morbidity or mortality.    Chief Complaint: I've been a busy bee.     HPI: Tasha Buckley sounded calm and relaxed during today's phone session and more cheerful than previous sessions. She caught up on the events of the last few months including starting cosmetology school. 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 Tasha Buckley is no longer planning on moving to [place] and will be in [place] for the duration of her program. Tasha Buckley described her mood as great and feels her mood as well as sleep schedule have stabilized over the last several months. She prefers Topamax to Lamictal; at this point Tasha Buckley is unsure if there has been any weight loss (she finds scales triggering) but she feels confident that she hasn't gained any weight since starting the medication. Additionally, Tasha Buckley has been feeling more confident about her weight overall. Interactions with family have been positive (she feels the time spent away from each other is beneficial) and she spends the weekends with her best friend. No safety concerns or thoughts of suicide/self-harm. Discussed intention to follow up in approximately 3 months depending on progress. No additional questions or concerns at this time.     Current Outpatient Medications   Medication Sig   ??? citalopram Take 1 tablet (40 mg total) by mouth daily.   ??? QUEtiapine Take 1 tablet (300 mg total) by mouth at bedtime.   ??? topiramate Take 1 tablet (25 mg total) by mouth 2 times a day.     No current facility-administered medications for this visit.      Physical Review of Systems/Objective:  The following systems were reviewed and reveal no abnormalities in addition to what is already discussed in the HPI: constitutional, nutrition/weight/body image, skin,  HEENT, cardiovascular, respiratory, GI, GU, musculoskeletal, and neurological. The patient reports normal muscle strength and tone.  Weight: 256.2 lbs (up 53 lbs since visit on 11/06/17).  Vital Signs:   There were no vitals filed for this visit.   ??  Mental Status Exam:  General: Unable to assess; phone session.  Speech: Normal rate, rhythm, articulations.  Normal quality. Sounded more cheerful than previous sessions.  Motor: Unable to assess; phone session.  Mood/ Affect: Mood described as ???really good???, unable to assess affect.  Perception: Denies auditory/visual/tactile/olfactory hallucinations.  Thought Content: Denies delusions, paranoia.   Thought Process: Linear, goal-directed.  Memory: Short and long-term memory grossly intact as evidenced by historical recall of verifiable facts from patient???s history.  Cognitive: Alert and oriented to all spheres, attention/concentration/focus intact and at patient???s baseline.  Safety:??Denies suicidal thoughts,??intent, or plan. Denies homicidal thoughts, intent plan. Denies plan for self-injury.  Insight/ Judgement:??Fair insight and limited judgement.    Diagnosis/Assessment/Plan:  1. Major depressive disorder, recurrent episode with mixed features (CMS Dx)   Chronic: moderate, improving. Prognosis: good. Continue Celexa 40 mg, Seroquel 300 mg, and Topamax 25 mg twice daily. Pt has not been compliant with Metformin 500 mg twice daily with meals and would like this discontinued    2. Borderline personality disorder (CMS Dx)   Chronic: moderate, improving. Prognosis: good. No additional medications indicated, encouraged continued participation in therapy      Other:  1. Return to clinic in 3 months for follow-up or sooner if indicated  2. Continue outpatient therapy bi-weekly ([name] recently started seeing a different provider at another facility)  3. Pt/family in agreement with plan and will call or e-mail with questions or concerns    Janace Litten, APRN-CNP  Psychiatric Mental-Health Nurse Practitioner   ??

## 2019-08-13 MED ORDER — citalopram (CELEXA) 40 MG tablet
40 | ORAL_TABLET | ORAL | 0 refills | Status: AC
Start: 2019-08-13 — End: 2019-09-08

## 2019-09-08 MED ORDER — QUEtiapine (SEROQUEL) 300 MG tablet
300 | ORAL_TABLET | Freq: Every evening | ORAL | 0 refills | Status: AC
Start: 2019-09-08 — End: 2019-10-29

## 2019-09-08 MED ORDER — topiramate (TOPAMAX) 25 MG tablet
25 | ORAL_TABLET | Freq: Two times a day (BID) | ORAL | 0 refills | Status: AC
Start: 2019-09-08 — End: 2019-10-29

## 2019-09-08 MED ORDER — citalopram (CELEXA) 40 MG tablet
40 | ORAL_TABLET | Freq: Every day | ORAL | 0 refills | Status: AC
Start: 2019-09-08 — End: 2019-10-29

## 2019-09-08 NOTE — Unmapped (Signed)
Clinician received a voicemail from pt's mother Lile Mccurley) regarding prescription refills. Per Amy [name] left for [facility] yesterday without telling anyone and forgot to bring her medications. She requested a small supply be sent to a pharmacy in [place]. Provided refill and encouraged follow-up appointment when pt returns home.

## 2019-10-29 MED ORDER — QUEtiapine (SEROQUEL) 300 MG tablet
300 | ORAL_TABLET | Freq: Every evening | ORAL | 0 refills | Status: AC
Start: 2019-10-29 — End: ?

## 2019-10-29 MED ORDER — citalopram (CELEXA) 40 MG tablet
40 | ORAL_TABLET | Freq: Every day | ORAL | 0 refills | Status: AC
Start: 2019-10-29 — End: ?

## 2019-10-29 MED ORDER — topiramate (TOPAMAX) 25 MG tablet
25 | ORAL_TABLET | Freq: Two times a day (BID) | ORAL | 0 refills | Status: AC
Start: 2019-10-29 — End: ?

## 2019-11-03 ENCOUNTER — Ambulatory Visit: Payer: PRIVATE HEALTH INSURANCE | Attending: Psychiatric/Mental Health

## 2019-11-03 NOTE — Unmapped (Signed)
No Show

## 2019-11-29 DIAGNOSIS — F332 Major depressive disorder, recurrent severe without psychotic features: Principal | ICD-10-CM

## 2019-11-30 ENCOUNTER — Inpatient Hospital Stay
Admission: EM | Admit: 2019-11-30 | Discharge: 2019-12-04 | Disposition: A | Discharge status: Home / Self Care | Payer: PRIVATE HEALTH INSURANCE | Admitting: Psychiatry

## 2019-11-30 DIAGNOSIS — F332 Major depressive disorder, recurrent severe without psychotic features: Secondary | ICD-10-CM

## 2019-11-30 LAB — COMPREHENSIVE METABOLIC PANEL
ALT: 16 U/L (ref 10–40)
AST: 16 U/L (ref 15–37)
Albumin/Globulin Ratio: 1.6 (ref 1.1–2.2)
Albumin: 4.5 g/dL (ref 3.4–5.0)
Alkaline Phosphatase: 86 U/L (ref 40–129)
Anion Gap: 12 (ref 3–16)
BUN: 13 mg/dL (ref 7–20)
CO2: 24 mmol/L (ref 21–32)
Calcium: 9.2 mg/dL (ref 8.3–10.6)
Chloride: 106 mmol/L (ref 99–110)
Creatinine: 0.7 mg/dL (ref 0.6–1.1)
GFR African American: >60 (ref >60)
GFR Non-African American: >60 (ref >60)
Glucose: 88 mg/dL (ref 70–99)
Potassium: 3.4 mmol/L — ABNORMAL LOW (ref 3.5–5.1)
Sodium: 142 mmol/L (ref 136–145)
Total Bilirubin: <0.2 mg/dL (ref 0.0–1.0)
Total Protein: 7.3 g/dL (ref 6.4–8.2)

## 2019-11-30 LAB — CBC WITH AUTO DIFFERENTIAL
Basophils %: 0.4 %
Basophils Absolute: 0 10*3/uL (ref 0.0–0.2)
Eosinophils %: 1.9 %
Eosinophils Absolute: 0.2 10*3/uL (ref 0.0–0.6)
Hematocrit: 41.2 % (ref 36.0–48.0)
Hemoglobin: 14.3 g/dL (ref 12.0–16.0)
Lymphocytes %: 29.1 %
Lymphocytes Absolute: 3.2 10*3/uL (ref 1.0–5.1)
MCH: 31 pg (ref 26.0–34.0)
MCHC: 34.6 g/dL (ref 31.0–36.0)
MCV: 89.5 fL (ref 80.0–100.0)
MPV: 7.6 fL (ref 5.0–10.5)
Monocytes %: 7.7 %
Monocytes Absolute: 0.8 10*3/uL (ref 0.0–1.3)
Neutrophils %: 60.9 %
Neutrophils Absolute: 6.7 10*3/uL (ref 1.7–7.7)
Platelets: 363 10*3/uL (ref 135–450)
RBC: 4.6 M/uL (ref 4.00–5.20)
RDW: 13 % (ref 12.4–15.4)
WBC: 10.9 10*3/uL (ref 4.0–11.0)

## 2019-11-30 LAB — SALICYLATE LEVEL: Salicylate, Serum: <0.3 mg/dL — ABNORMAL LOW (ref 15.0–30.0)

## 2019-11-30 LAB — ETHANOL: Ethanol Lvl: NOT DETECTED mg/dL

## 2019-11-30 LAB — ACETAMINOPHEN LEVEL: Acetaminophen Level: <5 ug/mL — ABNORMAL LOW (ref 10–30)

## 2019-11-30 LAB — COVID-19 & INFLUENZA COMBO
INFLUENZA A: NOT DETECTED
INFLUENZA B: NOT DETECTED
SARS-CoV-2 RNA, RT PCR: NOT DETECTED

## 2019-11-30 LAB — URINE DRUG SCREEN
Amphetamine Screen, Urine: NEGATIVE
Barbiturate Screen, Ur: NEGATIVE (ref <200)
Benzodiazepine Screen, Urine: NEGATIVE (ref <200)
Cannabinoid Scrn, Ur: POSITIVE — AB (ref <50)
Cocaine Metabolite Screen, Urine: NEGATIVE (ref <300)
Methadone Screen, Urine: NEGATIVE (ref <300)
Opiate Scrn, Ur: NEGATIVE (ref <300)
Oxycodone Urine: NEGATIVE (ref <100)
PCP Screen, Urine: NEGATIVE (ref <25)
Propoxyphene Scrn, Ur: NEGATIVE (ref <300)
pH, UA: 5

## 2019-11-30 LAB — HCG, SERUM, QUALITATIVE: hCG Qual: NEGATIVE

## 2019-11-30 MED ORDER — ARIPIPRAZOLE 10 MG PO TABS
10 MG | Freq: Every day | ORAL | Status: DC
Start: 2019-11-30 — End: 2019-12-02
  Administered 2019-12-01 – 2019-12-02 (×2): 10 mg via ORAL

## 2019-11-30 MED ORDER — HYDROXYZINE PAMOATE 25 MG PO CAPS
25 MG | Freq: Three times a day (TID) | ORAL | Status: DC | PRN
Start: 2019-11-30 — End: 2019-12-04

## 2019-11-30 MED ORDER — TETANUS-DIPHTH-ACELL PERTUSSIS 5-2.5-18.5 LF-MCG/0.5 IM SUSP
5-2.5-18.5-0.5 LF-MCG/0.5 | Freq: Once | INTRAMUSCULAR | Status: DC
Start: 2019-11-30 — End: 2019-12-04

## 2019-11-30 MED ORDER — NICOTINE POLACRILEX 2 MG MT LOZG
2 MG | OROMUCOSAL | Status: DC | PRN
Start: 2019-11-30 — End: 2019-12-04

## 2019-11-30 MED ORDER — ARIPIPRAZOLE 10 MG PO TABS
10 MG | Freq: Every day | ORAL | Status: AC
Start: 2019-11-30 — End: 2019-11-30
  Administered 2019-11-30: 17:00:00 5 mg via ORAL

## 2019-11-30 MED ORDER — TOPIRAMATE 25 MG PO TABS
25 MG | Freq: Every day | ORAL | Status: DC
Start: 2019-11-30 — End: 2019-12-02
  Administered 2019-11-30 – 2019-12-02 (×3): 50 mg via ORAL

## 2019-11-30 MED ORDER — CITALOPRAM HYDROBROMIDE 20 MG PO TABS
20 MG | Freq: Every day | ORAL | Status: DC
Start: 2019-11-30 — End: 2019-12-02
  Administered 2019-11-30 – 2019-12-02 (×3): 40 mg via ORAL

## 2019-11-30 MED ORDER — TRAZODONE HCL 50 MG PO TABS
50 MG | Freq: Every evening | ORAL | Status: DC | PRN
Start: 2019-11-30 — End: 2019-12-04
  Administered 2019-12-02 – 2019-12-03 (×2): 50 mg via ORAL

## 2019-11-30 MED ORDER — ACETAMINOPHEN 325 MG PO TABS
325 MG | ORAL | Status: DC | PRN
Start: 2019-11-30 — End: 2019-12-04
  Administered 2019-12-01: 22:00:00 650 mg via ORAL

## 2019-11-30 MED ORDER — POLYETHYLENE GLYCOL 3350 17 G PO PACK
17 g | Freq: Every day | ORAL | Status: DC | PRN
Start: 2019-11-30 — End: 2019-12-04

## 2019-11-30 MED FILL — TOPIRAMATE 25 MG PO TABS: 25 mg | ORAL | Qty: 2

## 2019-11-30 MED FILL — ARIPIPRAZOLE 10 MG PO TABS: 10 mg | ORAL | Qty: 1

## 2019-11-30 MED FILL — CITALOPRAM HYDROBROMIDE 20 MG PO TABS: 20 mg | ORAL | Qty: 2

## 2019-11-30 NOTE — Other (Signed)
Group Therapy Note    Date: 11/30/2019  Start Time: 1300  End Time:  1345  Number of Participants: 5    Type of Group: Worship Service    Notes:  Pt present for most of Publishing copy. While present, Pt sat quietly and listened.    Participation Level: Active Listener    Participation Quality: Appropriate and Attentive      Speech:  hesitant      Affective Functioning: Congruent      Endings: None Reported    Modes of Intervention: Support, Socialization, Exploration and Media      Discipline Responsible: Chaplain      SignatureChristiane Ha       11/30/19 1428   Encounter Summary   Services provided to: Patient   Continue Visiting   (11/1 Worship Service)   Complexity of Encounter Moderate   Length of Encounter 30 minutes

## 2019-12-01 MED FILL — CITALOPRAM HYDROBROMIDE 20 MG PO TABS: 20 mg | ORAL | Qty: 2

## 2019-12-01 MED FILL — MAPAP 325 MG PO TABS: 325 mg | ORAL | Qty: 2

## 2019-12-01 MED FILL — ARIPIPRAZOLE 10 MG PO TABS: 10 mg | ORAL | Qty: 1

## 2019-12-01 MED FILL — TOPIRAMATE 25 MG PO TABS: 25 mg | ORAL | Qty: 2

## 2019-12-02 MED ORDER — TOPIRAMATE 25 MG PO TABS
25 MG | Freq: Every evening | ORAL | Status: DC
Start: 2019-12-02 — End: 2019-12-04

## 2019-12-02 MED ORDER — CITALOPRAM HYDROBROMIDE 20 MG PO TABS
20 MG | Freq: Every evening | ORAL | Status: DC
Start: 2019-12-02 — End: 2019-12-04

## 2019-12-02 MED ORDER — ARIPIPRAZOLE 10 MG PO TABS
10 MG | Freq: Every evening | ORAL | Status: DC
Start: 2019-12-02 — End: 2019-12-04

## 2019-12-02 MED FILL — ARIPIPRAZOLE 10 MG PO TABS: 10 mg | ORAL | Qty: 1

## 2019-12-02 MED FILL — CITALOPRAM HYDROBROMIDE 20 MG PO TABS: 20 mg | ORAL | Qty: 2

## 2019-12-02 MED FILL — TRAZODONE HCL 50 MG PO TABS: 50 mg | ORAL | Qty: 1

## 2019-12-02 MED FILL — TOPIRAMATE 25 MG PO TABS: 25 mg | ORAL | Qty: 2

## 2019-12-02 NOTE — Group Note (Signed)
Group Therapy Note    Date: 12/02/2019    Group Start Time: 2000  Group End Time: 2030  Group Topic: Wrap-Up    [facility]    Benedict Needy        Group Therapy Note    Attendees: 10         Patient's Goal:  To be present during group      Notes:     Status After Intervention:  Improved    Participation Level: Active Listener    Participation Quality: Appropriate      Speech:  normal      Thought Process/Content: Logical      Affective Functioning: Congruent      Mood: normal      Level of consciousness:  Alert      Response to Learning: Able to verbalize current knowledge/experience      Endings: None Reported    Modes of Intervention: Support      Discipline Responsible: Microbiologist Health Tech      Signature:  Benedict Needy

## 2019-12-03 MED FILL — TRAZODONE HCL 50 MG PO TABS: 50 mg | ORAL | Qty: 1

## 2019-12-03 MED FILL — CITALOPRAM HYDROBROMIDE 20 MG PO TABS: 20 mg | ORAL | Qty: 2

## 2019-12-03 MED FILL — ARIPIPRAZOLE 10 MG PO TABS: 10 mg | ORAL | Qty: 1

## 2019-12-03 MED FILL — TOPIRAMATE 25 MG PO TABS: 25 mg | ORAL | Qty: 2

## 2019-12-03 NOTE — Behavioral Health Treatment Team (Signed)
..  Purposeful Rounding  For 2200    Patient Location: Patient room    Patient willing to engage in conversation: yes but is currently reading    Presentation/behavior: Controlled, Cooperative and Pleasant    Affect: Neutral/Euthymic(normal)    Concerns reported: none at this time    PRN medications given: none    Environmental assessment: Room free from clutter, Clear path to bathroom  and Adequate lighting    Fall prevention interventions in place: Lighting appropriate, Room free of clutter and Clear path to bathroom    Daily Edmonson Fall Risk Score: 65    Daily Morse Fall Risk Score: 0

## 2019-12-03 NOTE — Plan of Care (Signed)
Pt is alert and oriented X4. She is pleasant and cooperative with care. She is social  with her peers. She denies depression at this time. She denies SI/HI/AVH. She is not noted to be RTIS. She is independent with ADLs and her appetite is good. She is taking meds and attending groups.

## 2019-12-03 NOTE — Progress Notes (Signed)
Department of Psychiatry  Progress Note    Patient's chart was reviewed. Discussed with treatment team. Met with patient.     SUBJECTIVE:      Overall much better.     Active in the milieu, with peers, and programming.     Less SI.    Abilify moved to QHS dosing.    Discussed potential DC tomorrow.     ROS:   Patient has new complaints: no  Sleeping adequately:  Yes   Appetite adequate: Yes  Engaged in programming: Yes    OBJECTIVE:  VITALS:  BP 128/79    Pulse 77    Temp 97.5 ??F (36.4 ??C) (Tympanic)    Resp 17    Ht 5' 6"  (1.676 m)    Wt 240 lb (108.9 kg)    LMP  (LMP Unknown)    SpO2 97%    Breastfeeding No    BMI 38.74 kg/m??     Mental Status Examination:    Appearance: good grooming and hygiene  Behavior/Attitude toward examiner:  cooperative, attentive and good eye contact  Speech: Normal rate, volume, amount  Mood:  "better"  Affect:  mood incongruent      Thought processes:  Goal directed, linear, no LOA or gross disorganization  Thought Content: less SI, no HI, no delusions voiced, no obsessions  Perceptions: no AVH  Attention: attention span and concentration were intact to interview   Abstraction: intact  Cognition:  Alert and oriented to person, place, time, and situation, recall intact  Insight: fair  Judgment: fair    Medication:  Scheduled:  ??? citalopram  40 mg Oral Nightly   ??? ARIPiprazole  10 mg Oral Nightly   ??? topiramate  50 mg Oral Nightly   ??? Tetanus-Diphth-Acell Pertussis  0.5 mL IntraMUSCular Once        PRN:  acetaminophen, polyethylene glycol, hydrOXYzine, traZODone, nicotine polacrilex     FORMULATION:  This is a domiciled, never married, fully employed  24 year old with a self-reported history of depression and anxiety, who was  brought by her parents to our emergency department endorsing worsening  symptoms of depression and thoughts of suicide.  The patient meets  criteria for major depressive episode, recurrent, severe without  psychotic features.  Differential includes cluster B  personality   disorders; borderline personality disorder.  Given her  worsening symptoms and increasing suicidality, she requires inpatient  stabilization and treatment.    Principal Problem:    Major depressive disorder, recurrent severe without psychotic features ([place])  Active Problems:    Self-injurious behavior    Hypokalemia    Cannabis abuse    Obesity (BMI 35.0-39.9 without comorbidity)  Resolved Problems:    * No resolved hospital problems. *     PLAN:  1.  Admitted to Inpatient Psychiatry for stabilization and treatment.    2.  On admission, discussed treatment options at length.  She elected to stay on  citalopram but stop Risperdal.  We discussed alternatives and agreed on  Abilify.  She was not interested in going back to Seroquel given weight gain.   Started Abilify at 5 mg daily.Order q. 15-minute check for safety, programming, and  p.r.n. medication for anxiety, agitation and insomnia.    12/01/2019 - increased Abilify to 55m daily.   12/02/2199 - Abilify switched to QHS dosing given some sedation.     3.  Internal Medicine consult for admission.  Superficial Lacerations to Low Abdomen  - self inflicted, multiple  - TDAP  is ordered  - no signs of infection on exam  - declined topical neosporin  ??  Hypokalemia  - mild, 3.4  - pt is tolerating PO, denies any diarrhea, vomiting  - replete w/ diet, no further work up at this time    4.  Collateral information if available for diagnostic clarification and  care coordination.    5.  Estimated length of stay 5-8 days.  Voluntary. [employer] DC tomorrow.     Total time with patient was 35 minutes and more than 50 % of that time was spent counseling the patient on their symptoms, treatment, and expected goals.       Shelly Rubenstein, MD

## 2019-12-03 NOTE — Discharge Instructions (Signed)
Discharge Planning Complete.    Discharge Date: 12/04/19  Reason for Hospitalization: Depression  Discharge Diagnosis: Depression  Discharging to: Home    Your instructions:  Your physician here was Lennox Pippins, MD. If you have any questions please call the unit at [phone] for the adult unit or [phone] for [facility].    Please note that we have a patient family advisory council that meets the second Wednesday of January and the second Wednesday of July at 5pm in [place] at [facility]. Department leadership would love for you to attend to give feedback on what we are doing well and areas in which we can improve our patient care. Please come if you are able and feel free to reach out to Zadie Rhine of Nursing Services at [phone] with any questions.     The Crisis Number for [facility] is [phone](KGUR). This Hotline may be accessed 24 hours a day, 7 days a week.    Please follow up with your PCP regarding any pending labs.     Your next appointment is:  Name of Provider: Placido Sou  Provider specialty/license: NP  Date and time of appointment: 12/07/19 at 3:00pm  The type/s of services requested are: medication management  Agency name: [facility]  Address: [address], [place], [place] [address]  Phone Number: [phone]  Special instructions (what to bring to appointment, etc.): NA    Other appointments:   Name of Provider: Paulene Floor  Provider specialty/license: Therapist   Date and time of appointment: 01/04/20 at 9:00am - this is first available  The type/s of services requested are: counseling  Agency name: [facility]  Address: [address], [place], [place] [address]  Phone Number: [phone]  Special instructions (what to bring to appointment, etc.): NA     **With the current concerns for Coronavirus (COVID-19), please contact your providers prior to going in to their offices. Many businesses and agencies have adjusted their practices  to reduce spread of the illness. If you have any questions about the virus or recommendations for home care, please call the [facility] COVID-19 hotline at [phone]. For all emergencies, please contact 911.**    Discharge Completed By: Warden Fillers  Fax to: Follow up provider name and number  [facility]  [phone]    The following personal items were collected during your admission and were returned to you:    Valuables  Dentures: None  Vision - Corrective Lenses: Glasses  Hearing Aid: None  Jewelry: Ring, Earrings, Bracelet (pt currently has two rings on right hand, 3 braclets right wrist, left ear 2 earings in)  Body Piercings Removed: Yes (nose ring right nostril)  Clothing: Footwear, Pants, Shirt, Socks, Undergarments (Comment)  Were All Patient Medications Collected?: Yes  Disposition of Meds: Other (no narcotis home meds placed in locker)  Other Valuables:  (pink blanket, gray t shirt, blue tie-dye sweatpants)  Valuables Given To:  (given to pt)    By signing below, I understand and acknowledge receipt of the instructions indicated above.

## 2019-12-03 NOTE — Behavioral Health Treatment Team (Signed)
..  Purposeful Rounding    Patient Location: Day room    Patient willing to engage in conversation: Yes    Presentation/behavior: Controlled, Cooperative and Pleasant    Affect: Brightens with interaction    Concerns reported:none    PRN medications given: none    Environmental assessment: Room free from clutter, Clear path to bathroom  and Adequate lighting    Fall prevention interventions in place: Lighting appropriate, Room free of clutter and Clear path to bathroom    Daily Edmonson Fall Risk Score: 65    Daily Morse Fall Risk Score: low

## 2019-12-04 MED ORDER — ARIPIPRAZOLE 10 MG PO TABS
10 MG | ORAL_TABLET | Freq: Every evening | ORAL | 0 refills | Status: AC
Start: 2019-12-04 — End: ?
  Filled 2019-12-04: qty 30, 30d supply, fill #0

## 2019-12-04 NOTE — Behavioral Health Treatment Team (Signed)
Pt denies SI/HI/AVH. She states she is ready to go home today. She denies any needs. She is able to tell writer her plan for when suicidal thoughts arise.

## 2019-12-04 NOTE — Group Note (Signed)
Group Therapy Note    Date: 12/03/2019    Group Start Time: 1600  Group End Time: 1700  Group Topic: Healthy Living/Wellness    [facility]          Group Therapy Note    Attendees: 11/21         Status After Intervention:  Improved    Participation Level: Active Runner, broadcasting/film/video

## 2019-12-04 NOTE — Plan of Care (Signed)
Problem: Depressive Behavior With or Without Suicide Precautions:  Goal: Able to verbalize acceptance of life and situations over which he or she has no control  Description: Able to verbalize acceptance of life and situations over which he or she has no control  12/04/2019 1108 by Tyrell Antonio, RN  Outcome: Ongoing  12/03/2019 2354 by Verline Lema, RN  Outcome: Ongoing  Goal: Able to verbalize and/or display a decrease in depressive symptoms  Description: Able to verbalize and/or display a decrease in depressive symptoms  12/04/2019 1108 by Tyrell Antonio, RN  Outcome: Ongoing  12/03/2019 2354 by Verline Lema, RN  Outcome: Ongoing  Goal: Ability to disclose and discuss suicidal ideas will improve  Description: Ability to disclose and discuss suicidal ideas will improve  12/04/2019 1108 by Tyrell Antonio, RN  Outcome: Ongoing  12/03/2019 2354 by Verline Lema, RN  Outcome: Ongoing  Goal: Able to verbalize support systems  Description: Able to verbalize support systems  Outcome: Ongoing  Goal: Absence of self-harm  Description: Absence of self-harm  Outcome: Ongoing

## 2019-12-04 NOTE — Discharge Summary (Signed)
Department of Psychiatry    Discharge Summary      Nolon RodMargret Pronovost  [phone]    Admission date:   11/29/2019    Discharge:   Date: 12/04/2019  Location: home    Inpatient Provider: Victorino Dikehristopher Adreana Coull, MD  Unit: [facility]    Diagnosis on Admission:  Major depressive disorder recurrent, severe without psychotic features    Diagnosis on Discharge:  Major depressive disorder recurrent, severe without psychotic features    Active Hospital Problems    Diagnosis Date Noted   ??? Major depressive disorder, recurrent severe without psychotic features (HCC) [F33.2] 11/30/2019   ??? Hypokalemia [E87.6]    ??? Cannabis abuse [F12.10]    ??? Obesity (BMI 35.0-39.9 without comorbidity) [E66.9]      Reason for Admission:  From my admission note:  IDENTIFICATION:  This is a domiciled, never married, fully employed  24 year old with a self-reported history of depression and anxiety, who  was brought in by parents with worsening symptoms of depression and  thoughts of suicide.  ??  SOURCES OF INFORMATION:  The patient.  ED report.  ??  CHIEF COMPLAINT:  "I want to kill myself."  ??  HISTORY OF PRESENT ILLNESS:  The patient reports that she was doing well  up until about 2 weeks ago.  At that time, she developed "sudden"  symptoms of depression including low mood, anhedonia,  amotivation, fatigue, tearfulness, and self-reproach.  About a week ago,  she saw a new psychiatric nurse practitioner who switched her from  Seroquel to Risperdal.  She reports that over this past weekend she  began thinking more of suicide and, yesterday, began cutting her stomach  with a sewing rotator.  She also began thinking about overdosing.    She told to her parents who then brought her to our emergency department   for evaluation and treatment.  ??  She reports she wants to die, but feels safe here and says  "I won't do anything like that here."  Her affect is mood incongruent;   she presents euthymic (smiling, engaged, social).    ??  She reports that about 2 months  ago she began self-reducing her  Seroquel dose.  She had been on 300 mg for a while but was  concerned about weight gain, so she self-reduced it to 150 mg.  She  stopped taking it last Tuesday when she was switched to Risperdal.  ??  PSYCHIATRIC REVIEW OF SYSTEMS:  No mania or psychosis.  ??  STRESSORS:  No new stressors.  ??  PAST PSYCHIATRIC HISTORY:  She was hospitalized at [facility] in 2019 with suicidal ideation and self-harm.  She did their  partial hospitalization program in 2020 for depression.  She had  outpatient treatment there for 2 years before recently  switching to an outpatient psychiatric nurse practitioner with whom she  has had one visit so far.  When asked about previous suicide attempts,  she says "no, I've been too scared."  She is afraid of pain, not what  comes next.  She has had multiple medication trials including many  antidepressants, mood stabilizers, and antipsychotics.  ??  SUBSTANCE USE HISTORY:  Alcohol on weekends only.  THC, smokes every  other weekends.  No programs.  No other substances.  ??  PAST MEDICAL HISTORY:  No chronic conditions, traumatic brain injuries,  seizures or major surgeries.  ??  FAMILY PSYCHIATRIC HISTORY:  Mom, bipolar affective disorder, alcohol  use disorder.  Sister  OCD and learning disorder.  No suicides.  ??  CURRENT MEDICATIONS:  Celexa 40 mg daily, Topamax 50 mg daily to prevent  weight gain, and Risperdal 1 mg p.o. at bedtime.  ??  ALLERGIES:  No known drug allergies.  ??  SOCIAL HISTORY:  Born in [place], [place].  Two siblings.  Parents  married.  Growing up was challenging given her mom had mental illness.   No abuse.  She graduated high school and completed 2 years of college  before dropping out.  She lives with her parents.  She has  never been married.  She has no kids.  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  ??  REVIEW OF SYSTEMS:  She did not describe or endorse recent headaches,  change in  vision, chest pain, shortness of breath, cough, sore throat,  fevers, abdominal pain, neurological problems, bleeding problems or skin  problems.  She was moving all four extremities and speaking without  difficulty.  ??  MENTAL STATUS EXAMINATION on admission:  The patient was in personal clothes.  She was pleasant, spoke freely and had good eye contact.  She described her  mood as "very depressed" and had a mood incongruent affect.  She had no  psychomotor agitation or retardation.  ??  Her speech was nonpressured.  She spoke in a normal volume.  She was  oriented to the date, day, place and the context of this evaluation.   Her memory was intact.  ??  Her use of language, speech and educational attainment suggested an  average level of intellectual functioning.  ??  Her thought processes were organized and goal directed.  She did not  describe or endorse hallucinations, delusions or homicidal thinking.   She did endorse suicidal thinking but reported feeling safe here and  willing to approach staff with concerns.  ??  Her ability for abstract thought was fair based on interpretation of  simple proverbs.  ??  Insight and judgment were impaired.  ??  PHYSICAL EXAMINATION on admission:  VITAL SIGNS:  Temperature 98 degrees, pulse 70, respiratory rate 16,  blood pressure 122/70.  NEUROLOGIC:  Gait normal.  ??  LABORATORY DATA on admission:  Shows a CMP with a potassium at 3.4, otherwise, within normal limits.  Pregnancy test negative.  Ethanol level not detectable.   Urine drug screen positive for cannabis.  Acetaminophen and salicylate  levels below threshold.  CBC within normal limits.  COVID-19 negative.  ??  FORMULATION on admission:  This is a domiciled, never married, fully employed  24 year old with a self-reported history of depression and anxiety, who was  brought by her parents to our emergency department endorsing worsening  symptoms of depression and thoughts of suicide.  The patient meets  criteria for major  depressive episode, recurrent, severe without  psychotic features.  Differential includes cluster B personality  disorders, including borderline personality disorder.  Given her  worsening symptoms and increasing suicidality, she requires inpatient  stabilization and treatment.  ??  DIAGNOSES on admission:  1.  Major depressive disorder recurrent, severe without psychotic  Features. Differential includes borderline personality disorder.   2.  Cannabis use.    Hospital Course:   1. ??Admitted to Inpatient Psychiatry for stabilization and treatment.  ??  2. ??On admission, discussed??treatment options at length. ??She elected to stay??on  citalopram but stop Risperdal. ??We discussed alternatives and agreed on  Abilify. ??She was not interested in going back  on Seroquel given weight gain.??  Started??Abilify at 5 mg daily.Order q. 15-minute check for safety, programming, and  p.r.n. medication for anxiety, agitation and insomnia.  ??  12/01/2019 - increased Abilify to 10mg  daily.   12/02/2199 - Abilify switched to QHS dosing given some sedation.     Ms. Adinolfi stabilized with treatment including medication, programming, and the structured milieu. Her mood stabilized and improved, her SI resolved, and she became more future oriented. She became active and participatory in programming. She tolerated Abilify well after it was switched to QHS dosing. She demonstrated safe behavior throughout the admission. She committed to continuing treatment after discharge.     3. ??Internal Medicine consult for admission.  Superficial Lacerations to Low Abdomen  - self inflicted, multiple  - TDAP is ordered  - no signs of infection on exam  - declined topical neosporin  ??  Hypokalemia  - mild, 3.4  - pt is tolerating PO, denies any diarrhea, vomiting  - replete w/ diet, no further work up at this time  ??  4.Voluntary    Complications: none;  Yvette Friedl did not require emergency psychiatric intervention during this admission such as  restraint or emergency medication.      Vital signs in last 24 hours:  Vitals:    12/04/19 0833   BP: 105/76   Pulse: 77   Resp: 18   Temp: 98 ??F (36.7 ??C)   SpO2: 98%       Mental Status Examination on Discharge:    Appearance: good grooming and hygiene  Behavior/Attitude toward examiner: ??cooperative, attentive and good eye contact  Speech:??Normal rate, volume, amount  Mood: ??"better"  Affect: ??mood incongruent ??????  Thought processes: ??Goal directed, linear, no LOA or gross disorganization  Thought Content:??no SI, no HI, no delusions voiced, no obsessions  Perceptions: no AVH  Attention: attention span and concentration were intact to interview   Abstraction: intact  Cognition:  Alert and oriented to person, place, time, and situation, recall intact  Insight:??intact  Judgment:??intact    Discharge on regular diet, continue activity as tolerated.     Condition on Discharge:  Duaa Stelzner was in stable condition.  Windi Toro did not have suicidal or homicidal thoughts, and was future oriented.  Najee Cowens did not represent an imminent risk to self or others. However, given static risk factors, Adhira Jamil remains at perpetual elevated risk going forward.        Medication List      START taking these medications    ARIPiprazole 10 MG tablet  Commonly known as: ABILIFY  Take 1 tablet by mouth nightly        CONTINUE taking these medications    citalopram 40 MG tablet  Commonly known as: CELEXA     topiramate 50 MG tablet  Commonly known as: TOPAMAX        STOP taking these medications    QUEtiapine 300 MG tablet  Commonly known as: SEROQUEL     risperiDONE 1 MG disintegrating tablet  Commonly known as: RISPERDAL M-TABS           Where to Get Your Medications      These medications were sent to [facility] - [facility] - F [phone]  [facility] [address] [facility], [place]    Phone: [phone]   ?? ARIPiprazole 10 MG  tablet         Follow-up Plan:  The following was given to the patient at discharge:    The Crisis Number for [facility] is 3151761607(PXTG). This Hotline may be accessed 24 hours a day, 7 days a week.    Please follow up with your PCP regarding any pending labs.     Your next appointment is:  Name of Provider: Placido Sou  Provider specialty/license: NP  Date and time of appointment: 12/07/19 at 3:00pm  The type/s of services requested are: medication management  Agency name: [facility]  Address: [address], [place], [place] [address]  Phone Number: [phone]  Special instructions (what to bring to appointment, etc.): NA    Other appointments:   Name of Provider: Paulene Floor  Provider specialty/license: Therapist   Date and time of appointment: 01/04/20 at 9:00am - this is first available  The type/s of services requested are: counseling  Agency name: [facility]  Address: [address], [place], [place] [address]  Phone Number: [phone]  Special instructions (what to bring to appointment, etc.): NA     **With the current concerns for Coronavirus (COVID-19), please contact your providers prior to going in to their offices. Many businesses and agencies have adjusted their practices to reduce spread of the illness. If you have any questions about the virus or recommendations for home care, please call the 24/7 [facility] COVID-19 hotline at [phone]. For all emergencies, please contact 911.**    More than 30 minutes were spent with the patient in completing this  evaluation and more than 50% of the time was spent completing this  evaluation, providing counseling, and planning treatment with the  patient.

## 2019-12-04 NOTE — Behavioral Health Treatment Team (Signed)
..  Purposeful Rounding  For midnight      Patient Location: Patient room    Patient willing to engage in conversation: No    Presentation/behavior: Other sleeping*    Affect: uta, pt sleeping    Concerns reported: none    PRN medications given: none    Environmental assessment: Room free from clutter, Clear path to bathroom  and Adequate lighting    Fall prevention interventions in place: Lighting appropriate, Room free of clutter and Clear path to bathroom    Daily Edmonson Fall Risk Score:65    Daily Morse Fall Risk Score: 0

## 2019-12-04 NOTE — Group Note (Signed)
Group Therapy Note    Date: 12/04/2019    Group Start Time: 1000  Group End Time: 1055  Group Topic: Psychoeducation    [facility]    Tyechia Allmendinger Juanell Fairly, LISW        Group Therapy Note    Attendees: 10    Participants learned about Maslow's Hierarchy of Needs and completed a Self Care Circle activity sheet.     Notes:  Lynnann [name]) attended group today and she was interacted and engaged throughout group.    Status After Intervention:  Improved    Participation Level: Active Listener and Interactive    Participation Quality: Appropriate, Attentive and Sharing      Speech:  normal      Thought Process/Content: Logical      Affective Functioning: Congruent      Mood: euthymic      Level of consciousness:  Alert, Oriented x4 and Attentive      Response to Learning: Able to verbalize/acknowledge new learning and Progressing to goal      Endings: None Reported    Modes of Intervention: Education      Discipline Responsible: Social Worker/Counselor      Signature:  Lise Auer, LISW

## 2019-12-04 NOTE — Suicide Safety Plan (Signed)
SAFETY PLAN    A suicide Safety Plan is a document that supports someone when they are having thoughts of suicide.    Warning Signs that indicate a suicidal crisis may be developing: What (situations, thoughts, feelings, body sensations, behaviors, etc.) do you experience that lets you know you are beginning to think about suicide?  1. crying  2. quiet  3. Wanting to take pills and crash car    Internal Coping Strategies:  What things can I do (relaxation techniques, hobbies, physical activities, etc.) to take my mind off my problems without contacting another person?  1. sleep  2. journal  3. praying    People and social settings that provide distraction: Who can I call or where can I go to distract me?  Phone:    3. Place: My friends house            4. Place: living room with my mom  5. [employer]    People whom I can ask for help: Who can I call when I need help - for example, friends, family, clergy, someone else?  1. Name: Shelly Coss                Phone: [phone]  2. Name: Maryruth Bun  Phone:   3. Name: Sister Magda Paganini  Phone:     Professionals or [employer] agencies I can contact during a crisis: Who can I call for help - for example, my doctor, my psychiatrist, my psychologist, a mental health provider, a suicide hotline?  1. Clinician Name: Anda Latina   Phone:     Professionals or [employer] agencies I can contact during a crisis: Who can I call for help - for example, my doctor, my psychiatrist, my psychologist, a mental health provider, a suicide hotline?    1. Clinician Name: [facility]   Address: [address] [place], [place] [address]      Phone  #: [phone]    2. Suicide Prevention Lifeline: 1-800-273-TALK 541-614-7522)    The crisis number for [facility] is 508 161 5836.  You can use this number at any time to access emergency mental health services.  The crisis number for [facility] is 65 ([employer]  You can use this  number at any time to access emergency mental health services.  The crisis number for [facility] is [phone].  You can use this number at any time to access emergency mental health services.  The crisis number for [facility] is (301)546-2875 (SAVE). This crisis line is available 24 hours a day, seven days a week.  The crisis number for [facility] is 319-833-2090.  The crisis number for [facility] is 281-CARE. This crisis line is available 24 hours a day, seven days a week.  The crisis number for [facility] is 440-206-2007. This crisis line is available 24 hours a day, seven days a week.  The crisis number for [place] is 1-800-273-TALK.  You can use this number at any time to access emergency mental health services.  A National Crisis Number is 1-800-SUICIDE (437)807-4748).  You can use this number at any time to access emergency mental health services.  The crisis number for [facility] is [phone].  You can use this number at any time to access emergency mental health services.  [facility] is [phone]-NEED [phone]). You can use this number at any time to access emergency mental health services.  Making the environment safe: How can I make my environment (house/apartment/living space) safer? For example, can I remove guns, medications, and other items?  1. Lock up pills  2. Take away razors    Something that is important to me and worth living for is  Family

## 2019-12-04 NOTE — Progress Notes (Signed)
[facility]  Discharge Note    Pt discharged with followings belongings:   Dentures: None  Vision - Corrective Lenses: Glasses  Hearing Aid: None  Jewelry: Ring, Earrings, Bracelet (pt currently has two rings on right hand, 3 braclets right wrist, left ear 2 earings in)  Body Piercings Removed: Yes (nose ring right nostril)  Clothing: Footwear, Pants, Shirt, Socks, Undergarments (Comment)  Were All Patient Medications Collected?: Yes  Other Valuables:  (pink blanket, gray t shirt, blue tie-dye sweatpants)   Valuables returned to patient. Patient education on aftercare instructions: yes  Information faxed to [facility] by T. Crawford Givens, RN  at 11:42 AM .Patient verbalize understanding of AVS:  yes.    Status EXAM upon discharge:  Status and Exam  Normal: Yes  Facial Expression:  (WNL)  Affect: Appropriate  Level of Consciousness: Alert  Mood:Normal: Yes  Mood: Depressed  Motor Activity:Normal: Yes  Interview Behavior: Cooperative  Preception: Orient to Person, Orient to Time, Orient to Place, Orient to Situation  Attention:Normal: Yes  Attention:  (WNL)  Thought Processes:  (Linear)  Thought Content:Normal: Yes  Hallucinations: None  Delusions: No  Memory:Normal: Yes  Insight and Judgment: No  Insight and Judgment: Poor Judgment  Present Suicidal Ideation: No  Present Homicidal Ideation: No      Metabolic Screening:    No results found for: LABA1C    No results found for: CHOL  No results found for: TRIG  No results found for: HDL  No components found for: LDLCAL  No results found for: LABVLDL    Marta Lamas, RN    Bridge Appointment completed: Reviewed Discharge Instructions with patient.    Patient verbalizes understanding and agreement with the discharge plan using the teachback method.     Referral for Outpatient Tobacco Cessation Counseling, upon discharge (mark X if applicable and completed):    ( )  Hospital staff assisted patient to call Quit Line or faxed referral                                    during hospitalization                  ( )  Recognizing danger situations (included triggers and roadblocks), if not completed on admission                    ( )  Coping skills (new ways to manage stress, exercise, relaxation techniques, changing routine, distraction), if not completed on admission                                                           ( )  Basic information about quitting (benefits of quitting, techniques in how to quit, available resources, if not completed on admission  ( ) [facility]   ( ) Patient refused referral  (x ) Patient refused counseling  ( ) Patient refused smoking cessation medication upon discharge    Vaccinations (mark X if applicable and completed):  ( ) Patient states already received influenza vaccine elsewhere  ( ) Patient received influenza vaccine during this hospitalization  (x ) Patient refused influenza vaccine at this time

## 2020-02-05 ENCOUNTER — Inpatient Hospital Stay
Admit: 2020-02-05 | Discharge: 2020-02-05 | Disposition: A | Discharge status: Other Acute Inpatient Hospital | Payer: PRIVATE HEALTH INSURANCE | Attending: Emergency Medicine

## 2020-02-05 ENCOUNTER — Inpatient Hospital Stay
Admit: 2020-02-05 | Discharge: 2020-02-07 | Disposition: A | Discharge status: Home / Self Care | Payer: PRIVATE HEALTH INSURANCE | Source: Other Acute Inpatient Hospital | Attending: Psychiatry | Admitting: Psychiatry

## 2020-02-05 DIAGNOSIS — F32A Depression, unspecified: Secondary | ICD-10-CM

## 2020-02-05 DIAGNOSIS — T43592A Poisoning by other antipsychotics and neuroleptics, intentional self-harm, initial encounter: Principal | ICD-10-CM

## 2020-02-05 LAB — EKG 12-LEAD
Atrial Rate: 89 {beats}/min
Atrial Rate: 90 {beats}/min
P Axis: 36 degrees
P Axis: 31 degrees
P-R Interval: 136 ms
P-R Interval: 138 ms
Q-T Interval: 396 ms
Q-T Interval: 386 ms
QRS Duration: 86 ms
QRS Duration: 86 ms
QTc Calculation (Bazett): 481 ms
QTc Calculation (Bazett): 472 ms
R Axis: 13 degrees
R Axis: 57 degrees
T Axis: 23 degrees
T Axis: 3 degrees
Ventricular Rate: 89 {beats}/min
Ventricular Rate: 90 {beats}/min

## 2020-02-05 LAB — CBC WITH AUTO DIFFERENTIAL
Basophils %: 0.5 %
Basophils Absolute: 0 10*3/uL (ref 0.0–0.2)
Eosinophils %: 2.1 %
Eosinophils Absolute: 0.2 10*3/uL (ref 0.0–0.6)
Hematocrit: 42.3 % (ref 36.0–48.0)
Hemoglobin: 14.8 g/dL (ref 12.0–16.0)
Lymphocytes %: 28.3 %
Lymphocytes Absolute: 2.8 10*3/uL (ref 1.0–5.1)
MCH: 31 pg (ref 26.0–34.0)
MCHC: 35 g/dL (ref 31.0–36.0)
MCV: 88.5 fL (ref 80.0–100.0)
MPV: 7.4 fL (ref 5.0–10.5)
Monocytes %: 7 %
Monocytes Absolute: 0.7 10*3/uL (ref 0.0–1.3)
Neutrophils %: 62.1 %
Neutrophils Absolute: 6 10*3/uL (ref 1.7–7.7)
Platelets: 324 10*3/uL (ref 135–450)
RBC: 4.78 M/uL (ref 4.00–5.20)
RDW: 12.6 % (ref 12.4–15.4)
WBC: 9.7 10*3/uL (ref 4.0–11.0)

## 2020-02-05 LAB — URINALYSIS WITH REFLEX TO CULTURE
Bilirubin Urine: NEGATIVE
Glucose, Ur: NEGATIVE mg/dL
Ketones, Urine: NEGATIVE mg/dL
Leukocyte Esterase, Urine: NEGATIVE
Nitrite, Urine: NEGATIVE
Protein, UA: 100 mg/dL — AB
Specific Gravity, UA: >=1.03 (ref 1.005–1.030)
Urobilinogen, Urine: 0.2 E.U./dL (ref <2.0)
pH, UA: 5.5 (ref 5.0–8.0)

## 2020-02-05 LAB — BLOOD GAS, VENOUS
Base Excess, Ven: 1.7 mmol/L (ref <=3.0)
Carboxyhemoglobin: 0.9 % (ref 0.0–1.5)
HCO3, Venous: 29.1 mmol/L — ABNORMAL HIGH (ref 24.0–28.0)
Hemoglobin, Ven, Reduced: 25.9 %
MetHgb, Ven: 0.2 % (ref 0.0–1.5)
O2 Sat, Ven: 74 %
TC02 (Calc), Ven: 31 mmol/L
pCO2, Ven: 55.4 mmHg — ABNORMAL HIGH (ref 41.0–51.0)
pH, Ven: 7.329 — ABNORMAL LOW (ref 7.350–7.450)
pO2, Ven: 41.3 mmHg — ABNORMAL HIGH (ref 25.0–40.0)

## 2020-02-05 LAB — SALICYLATE LEVEL: Salicylate, Serum: <0.3 mg/dL — ABNORMAL LOW (ref 15.0–30.0)

## 2020-02-05 LAB — HEPATIC FUNCTION PANEL
ALT: 17 U/L (ref 10–40)
AST: 16 U/L (ref 15–37)
Albumin: 4.4 g/dL (ref 3.4–5.0)
Alkaline Phosphatase: 83 U/L (ref 40–129)
Bilirubin, Direct: <0.2 mg/dL (ref 0.0–0.3)
Total Bilirubin: 0.3 mg/dL (ref 0.0–1.0)
Total Protein: 7.4 g/dL (ref 6.4–8.2)

## 2020-02-05 LAB — BASIC METABOLIC PANEL W/ REFLEX TO MG FOR LOW K
Anion Gap: 13 (ref 3–16)
BUN: 16 mg/dL (ref 7–20)
CO2: 26 mmol/L (ref 21–32)
Calcium: 8.8 mg/dL (ref 8.3–10.6)
Chloride: 101 mmol/L (ref 99–110)
Creatinine: 0.8 mg/dL (ref 0.6–1.1)
GFR African American: >60 (ref >60)
GFR Non-African American: >60 (ref >60)
Glucose: 96 mg/dL (ref 70–99)
Potassium reflex Magnesium: 3.3 mmol/L — ABNORMAL LOW (ref 3.5–5.1)
Sodium: 140 mmol/L (ref 136–145)

## 2020-02-05 LAB — MICROSCOPIC URINALYSIS

## 2020-02-05 LAB — URINE DRUG SCREEN
Amphetamine Screen, Urine: NEGATIVE
Barbiturate Screen, Ur: NEGATIVE (ref <200)
Benzodiazepine Screen, Urine: NEGATIVE (ref <200)
Cannabinoid Scrn, Ur: NEGATIVE (ref <50)
Cocaine Metabolite Screen, Urine: NEGATIVE (ref <300)
Methadone Screen, Urine: NEGATIVE (ref <300)
Opiate Scrn, Ur: NEGATIVE (ref <300)
Oxycodone Urine: NEGATIVE (ref <100)
PCP Screen, Urine: NEGATIVE (ref <25)
Propoxyphene Scrn, Ur: NEGATIVE (ref <300)
pH, UA: 5.5

## 2020-02-05 LAB — MAGNESIUM: Magnesium: 1.8 mg/dL (ref 1.80–2.40)

## 2020-02-05 LAB — ACETAMINOPHEN LEVEL: Acetaminophen Level: <5 ug/mL — ABNORMAL LOW (ref 10–30)

## 2020-02-05 LAB — HCG, SERUM, QUALITATIVE: hCG Qual: NEGATIVE

## 2020-02-05 LAB — COVID-19, RAPID: SARS-CoV-2, NAAT: NOT DETECTED

## 2020-02-05 LAB — ETHANOL: Ethanol Lvl: NOT DETECTED mg/dL

## 2020-02-05 MED ORDER — ACTIDOSE WITH SORBITOL 25 GM/120ML PO LIQD
25 GM/120ML | Freq: Once | ORAL | Status: AC
Start: 2020-02-05 — End: 2020-02-05
  Administered 2020-02-05: 07:00:00 via ORAL

## 2020-02-05 MED ORDER — ONDANSETRON 4 MG PO TBDP
4 MG | Freq: Once | ORAL | Status: DC
Start: 2020-02-05 — End: 2020-02-05

## 2020-02-05 MED ORDER — ONDANSETRON HCL 4 MG/2ML IJ SOLN
4 MG/2ML | Freq: Once | INTRAMUSCULAR | Status: AC
Start: 2020-02-05 — End: 2020-02-05
  Administered 2020-02-05: 07:00:00 via INTRAVENOUS

## 2020-02-05 MED FILL — ACTIDOSE WITH SORBITOL 50 GM/240ML PO LIQD: 50 GM/240ML | ORAL | Qty: 240

## 2020-02-05 MED FILL — ONDANSETRON HCL 4 MG/2ML IJ SOLN: 4 MG/2ML | INTRAMUSCULAR | Qty: 2

## 2020-02-05 NOTE — ED Notes (Signed)
UDS recollected at this time.      Danella Sensing, RN  02/05/20 1003

## 2020-02-05 NOTE — ED Provider Notes (Signed)
THE [facility]  EMERGENCY DEPARTMENT ENCOUNTER          PHYSICIAN ASSISTANT NOTE       Date of evaluation: 02/05/2020    Chief Complaint     Suicide Attempt (Intentional. Took 10 seroquel 300mg  in attempt to harm herself. Pt. states has had these thoughts before but has never acted on them until today. States that has had recent increased stress between school/life. )      History of Present Illness     Sharon Higgins is a 25 y.o. female with a past medical history as noted below who presents to the Emergency Department with a complaint of an attempted suicide overdose.  The patient reports that approximately 10 to 15 minutes prior to ED arrival, patient intentionally took 10 of her mother's 300 mg Seroquels in an effort to harm herself.  The patient reports that she has been overwhelmed with stress from school and is concerned that she may have to drop out of her studies.  The patient reports that she has had an issue with depression and has a history of self-injurious behavior, but denies any attempt to end her life despite prior episodes of suicidal ideations.  The patient reports that as her thoughts manifested this evening, she came more more panic and stressed, and took 10 of the tablets, hoping that they would "put her to sleep forever."  The patient reports that she immediately became scared and remorseful in taking the medication and went to her mother to report what she had done.  Her mother brought her to the emergency department for evaluation.  Denies any auditory or visual hallucinations.  No homicidal ideations.  Is not experiencing any complaints at this time.  Denies any fevers, chills, sweats or other constitutional symptoms.    Review of Systems     Review of Systems   Constitutional: Negative for chills, diaphoresis, fever and unexpected weight change.   HENT: Negative for congestion, drooling, mouth sores, postnasal drip, rhinorrhea, sinus pressure and sore throat.    Eyes: Negative  for pain, redness and itching.   Respiratory: Negative for cough, chest tightness, shortness of breath and wheezing.    Cardiovascular: Negative for chest pain, palpitations and leg swelling.   Gastrointestinal: Negative for abdominal distention, abdominal pain, diarrhea, nausea and vomiting.   Genitourinary: Negative for dysuria and hematuria.   Musculoskeletal: Negative for arthralgias, back pain, gait problem, myalgias, neck pain and neck stiffness.   Skin: Negative for color change, pallor and rash.   Neurological: Negative for dizziness, seizures, syncope, weakness, light-headedness, numbness and headaches.   Hematological: Does not bruise/bleed easily.   Psychiatric/Behavioral: Positive for self-injury, sleep disturbance and suicidal ideas. Negative for agitation and hallucinations. The patient is not nervous/anxious.    All other systems reviewed and are negative.      Physical Exam     INITIAL VITALS: BP: 101/76,  , Pulse: 58, Resp: 16, SpO2: 99 %     Nursing note and vitals reviewed.    Physical Exam  Vitals and nursing note reviewed.   Constitutional:       General: She is not in acute distress.     Appearance: She is well-developed. She is not diaphoretic.   HENT:      Head: Normocephalic and atraumatic.   Eyes:      General: No scleral icterus.     Conjunctiva/sclera: Conjunctivae normal.      Pupils: Pupils are equal, round, and reactive to light.  Neck:      Vascular: No JVD.   Cardiovascular:      Rate and Rhythm: Normal rate and regular rhythm.      Heart sounds: Normal heart sounds.   Pulmonary:      Effort: Pulmonary effort is normal. No respiratory distress.      Breath sounds: Normal breath sounds. No stridor. No wheezing or rales.   Chest:      Chest wall: No tenderness.   Abdominal:      General: There is no distension.      Palpations: Abdomen is soft.      Tenderness: There is no abdominal tenderness.   Musculoskeletal:         General: No tenderness. Normal range of motion.      Cervical  back: Normal range of motion and neck supple.   Skin:     General: Skin is warm and dry.      Coloration: Skin is not pale.      Findings: No rash.   Neurological:      Mental Status: She is alert and oriented to person, place, and time.   Psychiatric:         Attention and Perception: She does not perceive auditory or visual hallucinations.         Mood and Affect: Mood is depressed. Affect is not angry or inappropriate.         Speech: Speech is not rapid and pressured, slurred or tangential.         Behavior: Behavior normal. Behavior is not agitated, aggressive or combative.         Thought Content: Thought content is not paranoid or delusional. Thought content includes suicidal ideation. Thought content does not include homicidal ideation. Thought content includes suicidal plan. Thought content does not include homicidal plan.         Cognition and Memory: Cognition is not impaired. Memory is not impaired.         Judgment: Judgment is impulsive and inappropriate.          Procedures     N/A    MEDICAL DECISION MAKING     Sharon Higgins is admitted to the Emergency Department for evaluation of her chief complaint as described in the history of present illness.  Complete history and physical was performed by me and my attending. Nursing notes, past medical history, surgical history, family history and social history were reviewed and addressed in the HPI.    Sharon Higgins is a 25 y.o. female who presents to the emergency department with a complaint of suicide attempt by overdose.  The patient reports that she intentionally took 10 Seroquel tablets that belonged to her mother this evening in an attempt to end her life.  The tablets were 300 mg, immediate release tablets.  The patient reports that she was immediately remorseful and scared, and told her mother of what she had done, who brought her to the emergency department immediately for evaluation.  The estimated time of ingestion was approximately 15  minutes prior to ED evaluation.  The patient reports that she has been experiencing increasing life school stressors, especially at school and is concerned that she may have to drop out of her studies.  She reported she was impulsive and panicked this evening, resulting in her taking medications.  She reports suicidal ideations in the past, but has never acted on them.  Has demonstrated some, more moderate evidence of self-injurious behavior.  Is been  treated for major depressive disorder for some time.     On arrival to the emergency department, the patient is hemodynamically stable and within normal limits.  She is in no distress.  Initial ECG demonstrates no abnormalities, including a normal QTC.  [facility] was contacted and consulted, and stated the patient needed to be monitored for period of at least 6 hours to monitor for increased sedation, QTC alterations and/or dysrhythmias concerning for cardiac toxicity and, less likely, seizures.  The patient has no complaints at this time.  A diagnostic evaluation for overdose was initiated to include CBC, BMP, liver function test, salicylate level, acetaminophen level, urinalysis, urine drug screen, and ethanol level, and a venous blood gas.  These labs are pending at the time of turnover at the end of shift and the patient will be turned over to the transition who will continue to evaluate and monitor the patient, follow-up on these diagnostics and determine disposition.  The statement of belief was signed by the attending physician given the patient's suicidal intentions.  I discussed this plan at length the patient who verbalizes understanding and is in agreement.     The patient was seen and evaluated by myself and the attending physician, Michel Bickers, MD, who agrees with my assessment, treatment and plan.    Clinical Impression     1. Intentional drug overdose, initial encounter ([place])    2. Suicidal ideations        Disposition     DISPOSITION pending      Doran Heater.  Terry Abila, PA-C  11:38 PM    Relevant History and Diagnostic Information     Past Medical, Surgical, Family, and Social History     She has a past medical history of Bipolar 1 disorder ([place]) and Depression.  She has no past surgical history on file.  Her family history is not on file.  She reports that she has never smoked. She has never used smokeless tobacco. She reports current alcohol use. She reports current drug use. Drug: Marijuana Sherrie Mustache).    Medications     Discharge Medication List as of 02/05/2020  2:11 PM      CONTINUE these medications which have NOT CHANGED    Details   ARIPiprazole (ABILIFY) 10 MG tablet Take 1 tablet by mouth nightly, Disp-30 tablet, R-0Normal      citalopram (CELEXA) 40 MG tablet Take 40 mg by mouth dailyHistorical Med      topiramate (TOPAMAX) 50 MG tablet Take 50 mg by mouth dailyHistorical Med             Allergies     She has No Known Allergies.    ED Course     Nursing Notes, Past Medical Hx, Past Surgical Hx, Social Hx,Allergies, and Family Hx were reviewed.    Patient was given the following medications:  Orders Placed This Encounter   Medications   ??? charcoal (ACTIDOSE/SORBITOL) liquid 25 g   ??? DISCONTD: ondansetron (ZOFRAN-ODT) disintegrating tablet 4 mg   ??? ondansetron (ZOFRAN) injection 4 mg       Diagnostic Results     EKG   Interpreted in conjunction with emergency department physician Michel Bickers, MD  Rhythm: normal sinus   Rate: normal  Axis: normal  Ectopy: none  Conduction: normal  ST Segments: normal  T Waves: normal  Q Waves:none  Clinical Impression: Sinus rhythm, normal axis, normal QTC, no evidence of ischemia, injury or infarct  Comparison: No prior ECG for comparison  ED BEDSIDE ULTRASOUND:  N/A    RECENT VITALS:  BP: 116/62, , Pulse: 68, Resp: 11, SpO2: 97 %     RADIOLOGY:  No orders to display       LABS:   Results for orders placed or performed during the hospital encounter of 02/05/20   COVID-19, Rapid    Specimen: Nasopharyngeal Swab   Result Value Ref Range     SARS-CoV-2, NAAT Not Detected Not Detected   CBC Auto Differential   Result Value Ref Range    WBC 9.7 4.0 - 11.0 K/uL    RBC 4.78 4.00 - 5.20 M/uL    Hemoglobin 14.8 12.0 - 16.0 g/dL    Hematocrit 79.3 90.3 - 48.0 %    MCV 88.5 80.0 - 100.0 fL    MCH 31.0 26.0 - 34.0 pg    MCHC 35.0 31.0 - 36.0 g/dL    RDW 00.9 23.3 - 00.7 %    Platelets 324 135 - 450 K/uL    MPV 7.4 5.0 - 10.5 fL    Neutrophils % 62.1 %    Lymphocytes % 28.3 %    Monocytes % 7.0 %    Eosinophils % 2.1 %    Basophils % 0.5 %    Neutrophils Absolute 6.0 1.7 - 7.7 K/uL    Lymphocytes Absolute 2.8 1.0 - 5.1 K/uL    Monocytes Absolute 0.7 0.0 - 1.3 K/uL    Eosinophils Absolute 0.2 0.0 - 0.6 K/uL    Basophils Absolute 0.0 0.0 - 0.2 K/uL   Basic Metabolic Panel w/ Reflex to MG   Result Value Ref Range    Sodium 140 136 - 145 mmol/L    Potassium reflex Magnesium 3.3 (L) 3.5 - 5.1 mmol/L    Chloride 101 99 - 110 mmol/L    CO2 26 21 - 32 mmol/L    Anion Gap 13 3 - 16    Glucose 96 70 - 99 mg/dL    BUN 16 7 - 20 mg/dL    CREATININE 0.8 0.6 - 1.1 mg/dL    GFR Non-African American >60 >60    GFR African American >60 >60    Calcium 8.8 8.3 - 10.6 mg/dL   Hepatic Function Panel   Result Value Ref Range    Total Protein 7.4 6.4 - 8.2 g/dL    Albumin 4.4 3.4 - 5.0 g/dL    Alkaline Phosphatase 83 40 - 129 U/L    ALT 17 10 - 40 U/L    AST 16 15 - 37 U/L    Total Bilirubin 0.3 0.0 - 1.0 mg/dL    Bilirubin, Direct <6.2 0.0 - 0.3 mg/dL    Bilirubin, Indirect see below 0.0 - 1.0 mg/dL   Ethanol   Result Value Ref Range    Ethanol Lvl None Detected mg/dL   Salicylate   Result Value Ref Range    Salicylate, Serum <0.3 (L) 15.0 - 30.0 mg/dL   Acetaminophen Level   Result Value Ref Range    Acetaminophen Level <5 (L) 10 - 30 ug/mL   Blood Gas, Venous   Result Value Ref Range    pH, Ven 7.329 (L) 7.350 - 7.450    pCO2, Ven 55.4 (H) 41.0 - 51.0 mmHg    pO2, Ven 41.3 (H) 25.0 - 40.0 mmHg    HCO3, Venous 29.1 (H) 24.0 - 28.0 mmol/L    Base Excess, Ven 1.7 -2.0 - 3.0 mmol/L     O2 Sat, Ven 74 Not  established %    Carboxyhemoglobin 0.9 0.0 - 1.5 %    MetHgb, Ven 0.2 0.0 - 1.5 %    TC02 (Calc), Ven 31 mmol/L    Hemoglobin, Ven, Reduced 25.90 %   Urinalysis Reflex to Culture    Specimen: Urine, clean catch   Result Value Ref Range    Color, UA Yellow Straw/Yellow    Clarity, UA Clear Clear    Glucose, Ur Negative Negative mg/dL    Bilirubin Urine Negative Negative    Ketones, Urine Negative Negative mg/dL    Specific Gravity, UA >=1.030 1.005 - 1.030    Blood, Urine TRACE-INTACT (A) Negative    pH, UA 5.5 5.0 - 8.0    Protein, UA 100 (A) Negative mg/dL    Urobilinogen, Urine 0.2 <2.0 E.U./dL    Nitrite, Urine Negative Negative    Leukocyte Esterase, Urine Negative Negative    Microscopic Examination YES     Urine Type NotGiven     Urine Reflex to Culture Not Indicated    HCG Qualitative, Serum   Result Value Ref Range    hCG Qual Negative Detects HCG level >10 MIU/mL   Urine Drug Screen   Result Value Ref Range    Amphetamine Screen, Urine Neg Negative <1000ng/mL    Barbiturate Screen, Ur Neg Negative <200 ng/mL    Benzodiazepine Screen, Urine Neg Negative <200 ng/mL    Cannabinoid Scrn, Ur Neg Negative <50 ng/mL    Cocaine Metabolite Screen, Urine Neg Negative <300 ng/mL    Opiate Scrn, Ur Neg Negative <300 ng/mL    PCP Screen, Urine Neg Negative <25 ng/mL    Methadone Screen, Urine Neg Negative <300 ng/mL    Propoxyphene Scrn, Ur Neg Negative <300 ng/mL    Oxycodone Urine Neg Negative <100 ng/ml    pH, UA 5.5     Drug Screen Comment: see below    Microscopic Urinalysis   Result Value Ref Range    Mucus, UA 1+ (A) None Seen /LPF    WBC, UA 6-9 (A) 0 - 5 /HPF    RBC, UA 0-2 0 - 4 /HPF    Epithelial Cells, UA 0-1 0 - 5 /HPF    Bacteria, UA 1+ (A) None Seen /HPF    Amorphous, UA 1+ /HPF   Magnesium   Result Value Ref Range    Magnesium 1.80 1.80 - 2.40 mg/dL   EKG 12 Lead   Result Value Ref Range    Ventricular Rate 89 BPM    Atrial Rate 89 BPM    P-R Interval 136 ms    QRS Duration 86 ms     Q-T Interval 396 ms    QTc Calculation (Bazett) 481 ms    P Axis 36 degrees    R Axis 13 degrees    T Axis 23 degrees    Diagnosis       EKG performed in ER and to be interpreted by ER physician.Confirmed by MD, ER ([id number]), editor MORGAN, NICKIA [phone]) on 02/05/2020 5:50:52 AM       CONSULTS:  None    PATIENT REFERRED TO:  No follow-up provider specified.    DISCHARGE MEDICATIONS:  Discharge Medication List as of 02/05/2020  2:11 PM             Almedia Balls Amour Trigg, PA  02/07/20 [phone]

## 2020-02-05 NOTE — ED Provider Notes (Signed)
ED Attending Attestation Note     Date of evaluation: 02/05/2020    This patient was seen by the advance practice provider.  I have seen and examined the patient, agree with the workup, evaluation, management and diagnosis. The care plan has been discussed.  I have reviewed the ECG and concur with the APP's interpretation.  My assessment reveals him with history of psychiatric disease presents after suicidal attempt.  Patient reports taking 10-300 mg tablets of immediate release Seroquel and attempt to harm herself.  She denies any other ingestions.  Statement of belief was signed.  On arrival, patient is awake and alert.  She is hemodynamically stable.  EKG shows normal sinus rhythm with borderline QT interval of 481.  Repeat EKG shows the patient to be in normal sinus rhythm with improving QTC of 472.  Laboratory studies are unremarkable.  Drug and Poison Information Center was consulted and recommended 6 hours of observation from time of ingestion.  Patient remained hemodynamically stable throughout this time and at this point is medically cleared for psychiatric evaluation.    Clinical impression:  1.  Intentional drug overdose, initial encounter  2.  Suicidal ideations     Leota Sauers, MD  02/05/20 [phone]

## 2020-02-05 NOTE — Behavioral Health Treatment Team (Signed)
1845 received from [facility] per stretcher. Alert and oriented. Oriented to room and surroundings. Pleasant. Received personal clothing and food. Sitting in TV area watching TV.

## 2020-02-05 NOTE — Group Note (Signed)
Group Therapy Note    Date: 02/05/2020    Group Start Time: 2030  Group End Time: 2055  Group Topic: Wrap-Up    [facility]    Rolland Bimler B        Group Therapy Note    Attendees: Goals and importance of goal setting discussed.  Night time milieu activities discussed.         Patient's Goal:  Learn coping skills    Notes:  Successful     Status After Intervention:  Improved    Participation Level: Active Listener and Interactive    Participation Quality: Appropriate and Attentive      Speech:  normal      Thought Process/Content: Logical  Linear      Affective Functioning: Congruent      Mood: anxious      Level of consciousness:  Alert and Oriented x4      Response to Learning: Progressing to goal      Endings: None Reported    Modes of Intervention: Support      Discipline Responsible: [employer]      Signature:  Salvadore Farber

## 2020-02-05 NOTE — ED Provider Notes (Signed)
THE [facility]  EMERGENCY DEPARTMENT ENCOUNTER          ATTENDING PHYSICIAN NOTE       Date of evaluation: 02/05/2020    ADDENDUM:      Care of this patient was assumed from Dr. Bonnita Nasuti.  The patient was seen for Suicide Attempt (Intentional. Took 10 seroquel 300mg  in attempt to harm herself. Pt. states has had these thoughts before but has never acted on them until today. States that has had recent increased stress between school/life. )  .  The patient's initial evaluation and plan have been discussed with the prior provider who initially evaluated the patient.  Nursing Notes, Past Medical Hx, Past Surgical Hx, Social Hx, Allergies, and Family Hx were all reviewed.    Diagnostic Results   Physical Exam  Vitals and nursing note reviewed.   Constitutional:       Appearance: Normal appearance. She is normal weight.   HENT:      Head: Normocephalic and atraumatic.      Nose: Nose normal.   Eyes:      Extraocular Movements: Extraocular movements intact.      Pupils: Pupils are equal, round, and reactive to light.   Cardiovascular:      Rate and Rhythm: Normal rate and regular rhythm.      Pulses: Normal pulses.      Heart sounds: Normal heart sounds.   Pulmonary:      Effort: Pulmonary effort is normal.      Breath sounds: Normal breath sounds.   Abdominal:      General: There is no distension.   Musculoskeletal:         General: Normal range of motion.      Cervical back: Normal range of motion and neck supple.      Right lower leg: No edema.      Left lower leg: No edema.   Skin:     General: Skin is warm.      Capillary Refill: Capillary refill takes less than 2 seconds.   Neurological:      General: No focal deficit present.      Mental Status: She is alert and oriented to person, place, and time. Mental status is at baseline.   Psychiatric:         Mood and Affect: Mood normal.             EKG       RADIOLOGY:  No orders to display       LABS:   Results for orders placed or performed during the hospital  encounter of 02/05/20   COVID-19, Rapid    Specimen: Nasopharyngeal Swab   Result Value Ref Range    SARS-CoV-2, NAAT Not Detected Not Detected   CBC Auto Differential   Result Value Ref Range    WBC 9.7 4.0 - 11.0 K/uL    RBC 4.78 4.00 - 5.20 M/uL    Hemoglobin 14.8 12.0 - 16.0 g/dL    Hematocrit 04/04/20 03.7 - 48.0 %    MCV 88.5 80.0 - 100.0 fL    MCH 31.0 26.0 - 34.0 pg    MCHC 35.0 31.0 - 36.0 g/dL    RDW 04.8 88.9 - 16.9 %    Platelets 324 135 - 450 K/uL    MPV 7.4 5.0 - 10.5 fL    Neutrophils % 62.1 %    Lymphocytes % 28.3 %    Monocytes % 7.0 %  Eosinophils % 2.1 %    Basophils % 0.5 %    Neutrophils Absolute 6.0 1.7 - 7.7 K/uL    Lymphocytes Absolute 2.8 1.0 - 5.1 K/uL    Monocytes Absolute 0.7 0.0 - 1.3 K/uL    Eosinophils Absolute 0.2 0.0 - 0.6 K/uL    Basophils Absolute 0.0 0.0 - 0.2 K/uL   Basic Metabolic Panel w/ Reflex to MG   Result Value Ref Range    Sodium 140 136 - 145 mmol/L    Potassium reflex Magnesium 3.3 (L) 3.5 - 5.1 mmol/L    Chloride 101 99 - 110 mmol/L    CO2 26 21 - 32 mmol/L    Anion Gap 13 3 - 16    Glucose 96 70 - 99 mg/dL    BUN 16 7 - 20 mg/dL    CREATININE 0.8 0.6 - 1.1 mg/dL    GFR Non-African American >60 >60    GFR African American >60 >60    Calcium 8.8 8.3 - 10.6 mg/dL   Hepatic Function Panel   Result Value Ref Range    Total Protein 7.4 6.4 - 8.2 g/dL    Albumin 4.4 3.4 - 5.0 g/dL    Alkaline Phosphatase 83 40 - 129 U/L    ALT 17 10 - 40 U/L    AST 16 15 - 37 U/L    Total Bilirubin 0.3 0.0 - 1.0 mg/dL    Bilirubin, Direct <8.4 0.0 - 0.3 mg/dL    Bilirubin, Indirect see below 0.0 - 1.0 mg/dL   Ethanol   Result Value Ref Range    Ethanol Lvl None Detected mg/dL   Salicylate   Result Value Ref Range    Salicylate, Serum <0.3 (L) 15.0 - 30.0 mg/dL   Acetaminophen Level   Result Value Ref Range    Acetaminophen Level <5 (L) 10 - 30 ug/mL   Blood Gas, Venous   Result Value Ref Range    pH, Ven 7.329 (L) 7.350 - 7.450    pCO2, Ven 55.4 (H) 41.0 - 51.0 mmHg    pO2, Ven 41.3 (H) 25.0 -  40.0 mmHg    HCO3, Venous 29.1 (H) 24.0 - 28.0 mmol/L    Base Excess, Ven 1.7 -2.0 - 3.0 mmol/L    O2 Sat, Ven 74 Not established %    Carboxyhemoglobin 0.9 0.0 - 1.5 %    MetHgb, Ven 0.2 0.0 - 1.5 %    TC02 (Calc), Ven 31 mmol/L    Hemoglobin, Ven, Reduced 25.90 %   Urinalysis Reflex to Culture    Specimen: Urine, clean catch   Result Value Ref Range    Color, UA Yellow Straw/Yellow    Clarity, UA Clear Clear    Glucose, Ur Negative Negative mg/dL    Bilirubin Urine Negative Negative    Ketones, Urine Negative Negative mg/dL    Specific Gravity, UA >=1.030 1.005 - 1.030    Blood, Urine TRACE-INTACT (A) Negative    pH, UA 5.5 5.0 - 8.0    Protein, UA 100 (A) Negative mg/dL    Urobilinogen, Urine 0.2 <2.0 E.U./dL    Nitrite, Urine Negative Negative    Leukocyte Esterase, Urine Negative Negative    Microscopic Examination YES     Urine Type NotGiven     Urine Reflex to Culture Not Indicated    HCG Qualitative, Serum   Result Value Ref Range    hCG Qual Negative Detects HCG level >10 MIU/mL   Urine Drug Screen  Result Value Ref Range    Amphetamine Screen, Urine Neg Negative <1000ng/mL    Barbiturate Screen, Ur Neg Negative <200 ng/mL    Benzodiazepine Screen, Urine Neg Negative <200 ng/mL    Cannabinoid Scrn, Ur Neg Negative <50 ng/mL    Cocaine Metabolite Screen, Urine Neg Negative <300 ng/mL    Opiate Scrn, Ur Neg Negative <300 ng/mL    PCP Screen, Urine Neg Negative <25 ng/mL    Methadone Screen, Urine Neg Negative <300 ng/mL    Propoxyphene Scrn, Ur Neg Negative <300 ng/mL    Oxycodone Urine Neg Negative <100 ng/ml    pH, UA 5.5     Drug Screen Comment: see below    Microscopic Urinalysis   Result Value Ref Range    Mucus, UA 1+ (A) None Seen /LPF    WBC, UA 6-9 (A) 0 - 5 /HPF    RBC, UA 0-2 0 - 4 /HPF    Epithelial Cells, UA 0-1 0 - 5 /HPF    Bacteria, UA 1+ (A) None Seen /HPF    Amorphous, UA 1+ /HPF   Magnesium   Result Value Ref Range    Magnesium 1.80 1.80 - 2.40 mg/dL   EKG 12 Lead   Result Value Ref Range     Ventricular Rate 89 BPM    Atrial Rate 89 BPM    P-R Interval 136 ms    QRS Duration 86 ms    Q-T Interval 396 ms    QTc Calculation (Bazett) 481 ms    P Axis 36 degrees    R Axis 13 degrees    T Axis 23 degrees    Diagnosis       EKG performed in ER and to be interpreted by ER physician.Confirmed by MD, ER ([id number]), editor MORGAN, NICKIA [phone] on 02/05/2020 5:50:52 AM   EKG 12 Lead   Result Value Ref Range    Ventricular Rate 90 BPM    Atrial Rate 90 BPM    P-R Interval 138 ms    QRS Duration 86 ms    Q-T Interval 386 ms    QTc Calculation (Bazett) 472 ms    P Axis 31 degrees    R Axis 57 degrees    T Axis 3 degrees    Diagnosis       EKG performed in ER and to be interpreted by ER physician.Confirmed by MD, ER ([id number]), editor MORGAN, NICKIA [phone] on 02/05/2020 7:16:29 AM       RECENT VITALS:  BP: 95/73,  , Pulse: 74, Resp: 15, SpO2: 94 %     Procedures         ED Course     The patient was given the following medications:  Orders Placed This Encounter   Medications   ??? charcoal (ACTIDOSE/SORBITOL) liquid 25 g   ??? DISCONTD: ondansetron (ZOFRAN-ODT) disintegrating tablet 4 mg   ??? ondansetron (ZOFRAN) injection 4 mg       CONSULTS:  None    MEDICAL DECISION MAKING / ASSESSMENT / PLAN     Sharon Higgins is a 25 y.o. female who was turned over pending reassessment after period of observation.  Briefly, this is a patient who had an intentional overdose of Seroquel.  She has been observed in the emergency department for approximately 12 hours and is appropriately medically cleared.  This was discussed with [facility].  She was accepted in transfer.      Clinical Impression  1. Intentional drug overdose, initial encounter (HCC)    2. Suicidal ideations        Disposition     PATIENT REFERRED TO:  No follow-up provider specified.    DISCHARGE MEDICATIONS:  New Prescriptions    No medications on file       DISPOSITION Decision To Transfer 02/05/2020 07:04:06 AM       Rudi Rummage, MD  02/05/20  1205

## 2020-02-05 NOTE — ED Notes (Signed)
Report called to [facility] Rn for room 2307.     Josem Kaufmann, RN  02/05/20 [phone]

## 2020-02-05 NOTE — ED Notes (Signed)
Pt transferred to [facility].     Josem Kaufmann, RN  02/05/20 1755

## 2020-02-06 LAB — BASIC METABOLIC PANEL W/ REFLEX TO MG FOR LOW K
Anion Gap: 12 (ref 3–16)
BUN: 13 mg/dL (ref 7–20)
CO2: 23 mmol/L (ref 21–32)
Calcium: 9.8 mg/dL (ref 8.3–10.6)
Chloride: 105 mmol/L (ref 99–110)
Creatinine: 0.8 mg/dL (ref 0.6–1.1)
GFR African American: >60 (ref >60)
GFR Non-African American: >60 (ref >60)
Glucose: 97 mg/dL (ref 70–99)
Potassium reflex Magnesium: 4.7 mmol/L (ref 3.5–5.1)
Sodium: 140 mmol/L (ref 136–145)

## 2020-02-06 LAB — TSH: TSH: 1.16 u[IU]/mL (ref 0.27–4.20)

## 2020-02-06 MED ORDER — CITALOPRAM HYDROBROMIDE 20 MG PO TABS
20 MG | Freq: Every evening | ORAL | Status: DC
Start: 2020-02-06 — End: 2020-02-07
  Administered 2020-02-07: 03:00:00 via ORAL

## 2020-02-06 MED ORDER — MAGNESIUM HYDROXIDE 400 MG/5ML PO SUSP
400 MG/5ML | Freq: Every day | ORAL | Status: DC | PRN
Start: 2020-02-06 — End: 2020-02-07

## 2020-02-06 MED ORDER — OLANZAPINE 10 MG IM SOLR
10 | INTRAMUSCULAR | Status: DC | PRN
Start: 2020-02-06 — End: 2020-02-07

## 2020-02-06 MED ORDER — STERILE WATER FOR INJECTION IJ SOLN
INTRAMUSCULAR | Status: DC | PRN
Start: 2020-02-06 — End: 2020-02-07

## 2020-02-06 MED ORDER — ARIPIPRAZOLE 15 MG PO TABS
15 MG | Freq: Every evening | ORAL | Status: DC
Start: 2020-02-06 — End: 2020-02-07
  Administered 2020-02-07: 03:00:00 via ORAL

## 2020-02-06 MED ORDER — TRAZODONE HCL 50 MG PO TABS
50 MG | Freq: Every evening | ORAL | Status: DC | PRN
Start: 2020-02-06 — End: 2020-02-07

## 2020-02-06 MED ORDER — HYDROXYZINE HCL 50 MG PO TABS
50 MG | Freq: Three times a day (TID) | ORAL | Status: DC | PRN
Start: 2020-02-06 — End: 2020-02-05

## 2020-02-06 MED ORDER — ACETAMINOPHEN 325 MG PO TABS
325 MG | ORAL | Status: DC | PRN
Start: 2020-02-06 — End: 2020-02-07

## 2020-02-06 MED ORDER — HYDROXYZINE PAMOATE 50 MG PO CAPS
50 MG | Freq: Three times a day (TID) | ORAL | Status: DC | PRN
Start: 2020-02-06 — End: 2020-02-07

## 2020-02-06 MED ORDER — OLANZAPINE 10 MG PO TABS
10 MG | ORAL | Status: DC | PRN
Start: 2020-02-06 — End: 2020-02-07

## 2020-02-06 MED ORDER — IBUPROFEN 400 MG PO TABS
400 MG | Freq: Four times a day (QID) | ORAL | Status: DC | PRN
Start: 2020-02-06 — End: 2020-02-07

## 2020-02-06 MED FILL — HYDROXYZINE HCL 50 MG PO TABS: 50 mg | ORAL | Qty: 1

## 2020-02-06 NOTE — Behavioral Health Treatment Team (Signed)
02/06/20 1753   Psychiatric History   Psychiatric history treatment Psychiatric admissions  ([facility], [facility])   Are there any medication issues? No   Support System   Support system Adequate   Types of Support System Mother;Father;Sister;Friend   Problems in support system None   Current Living Situation   Home Living Adequate   Living information Lives with others   Problems with living situation  No   Lack of basic needs No   SSDI/SSI Denies   Other government assistance denies   Problems with environment denies   Current abuse issues denies   Supervised setting None   Relationship problems No   Medical and Self-Care Issues   Relevant medical problems denies   Relevant self-care issues denies   Barriers to treatment No   Family Constellation   Spouse/partner-name/age Denies   Children-names/ages denies   Parents Mom and Dad   Siblings Kendal Hymen and Coppell   Support services   (Denies)   Childhood   Raised by Biological mother;Biological father   Biological mother Amy   Biological father [name]   Relevant family history Mom was an alcoholic and bipolar, Dad has depression   Legal History   Legal history No   Other relevant legal issues Denies   Comment Denies   Juvenile legal history No    Abuse Assessment   Physical Abuse Denies   Verbal Abuse Denies   Emotional abuse Denies   Financial Abuse Denies   Sexual abuse Denies   Substance Use   Use of substances  No   Motivation for SA Treatment   Stage of engagement   (denies)   Motivation for treatment   (denies)   Current barriers to treatment   (denies)   Comment denies   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 Reports only one attempt. 02/05/20. Pt denies any current SI/HI/AVH.

## 2020-02-06 NOTE — Group Note (Signed)
Group Therapy Note    Date: 02/06/2020    Group Start Time: 10:00 AM   Group End Time: 10:30 AM  Group Topic: Community Meeting    [facility]    [name], [place]        Group Therapy Note    Attendees: 10       Patient's Goal: Write a pros and cons list about returning to school.     Notes:  Pt was appropriate and participated in group discussion.    Status After Intervention:  Improved    Participation Level: Active Listener and Interactive    Participation Quality: Appropriate, Attentive, Sharing and Supportive      Speech:  normal      Thought Process/Content: Logical  Linear      Affective Functioning: Flat      Mood: euthymic      Level of consciousness:  Alert, Oriented x4 and Attentive      Response to Learning: Able to verbalize current knowledge/experience and Progressing to goal      Endings: None Reported    Modes of Intervention: Socialization      Discipline Responsible: Social Worker/Counselor      Signature:  [name], LSW

## 2020-02-06 NOTE — Group Note (Signed)
Group Therapy Note    Date: 02/06/2020    Group Start Time: 8:00 PM  Group End Time: 8:30 PM  Group Topic: [place], LSW        Group Therapy Note    Attendees: 11       Patient's Goal: Write a pros and cons list for going back to school.    Notes:  Pt met goal.     Status After Intervention:  Improved    Participation Level: Active Listener and Interactive    Participation Quality: Appropriate, Attentive, Sharing and Supportive      Speech:  normal      Thought Process/Content: Logical  Linear      Affective Functioning: Congruent      Mood: euthymic      Level of consciousness:  Alert, Oriented x4 and Attentive      Response to Learning: Able to verbalize current knowledge/experience, Able to verbalize/acknowledge new learning and Progressing to goal      Endings: None Reported    Modes of Intervention: Support, Socialization and Activity      Discipline Responsible: Social Worker/Counselor      Signature:  Ernestine Mcmurray, LSW

## 2020-02-06 NOTE — Other (Signed)
Purposeful Rounding  ??  Patient Location: Day room  ??  Patient willing to engage in conversation: Yes  ??  Presentation/behavior: Withdrawn  ??  Affect: Brightens with interaction  ??  Concerns reported: no  ??  PRN medications given: no  ??  Environmental assessment: No safety hazards noted  ??  Fall prevention interventions in place: Lighting appropriate and Clear path to bathroom  ??  Daily Edmonson Fall Risk Score: 65  ??  Daily Morse Fall Risk Score: 0

## 2020-02-06 NOTE — Group Note (Signed)
Group Therapy Note    Date: 02/06/2020    Group Start Time: 1:00 PM  Group End Time: 2:00 PM  Group Topic: Recreational    [facility]    [name], LSW        Group Therapy Note    Attendees: 11         Patient's Goal:   Participate in discussion about how to use assertive communication. Pt will participate in assertive communication role play.    Notes: Pt demonstrated new learnings, was appropriate and pleasant.    Status After Intervention:  Improved    Participation Level: Active Listener and Interactive    Participation Quality: Appropriate, Attentive, Sharing and Supportive      Speech: soft      Thought Process/Content: Logical  Linear      Affective Functioning: Flat      Mood: anxious      Level of consciousness:  Alert, Oriented x4 and Attentive      Response to Learning: Able to verbalize current knowledge/experience, Able to verbalize/acknowledge new learning, Able to retain information and Progressing to goal      Endings: None Reported    Modes of Intervention: Support, Socialization and Activity      Discipline Responsible: Social Worker/Counselor      Signature:  [name], LSW

## 2020-02-06 NOTE — H&P (Signed)
Psychiatric Admission Evaluation       Admission Date:    02/05/2020  Identifying Info:   Patient is a 25 y.o. female with hx of depression   Patient was admitted to psychiatric unit on a voluntarily basis.  Chief complaint:  Depression and suicide attempt via seroquel od    HPI: 25 y/o wf with hx of depression that presented to ed after od on mom's seroquel. Pt stated that it was an impulsive act and she regrets taking the od. Pt stated that she has been stressing about cosmetology school and letting her parents down. Pt stated that she does not like it and wishes not to pursue it and stated that she enrolled to the school as an impulsive decision. 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 Pt stated that she feels anxious about been here in the hospital. Pt stated that she has been taking celexa and abilify and she feels meds are working for him. Pt stated that she needs to developed coping skills in order not to act impulsively. No psychosis and no mania reported.       current medications    Prior to Admission medications    Medication Sig Start Date End Date Taking? Authorizing Provider   ARIPiprazole (ABILIFY) 10 MG tablet Take 1 tablet by mouth nightly 12/04/19   Rodell Perna, MD   citalopram (CELEXA) 40 MG tablet Take 40 mg by mouth daily    Historical Provider, MD   topiramate (TOPAMAX) 50 MG tablet Take 50 mg by mouth daily    Historical Provider, MD     Past psychiatric and medical history:  Hosp:multiple admission and hx of self harm last admission at [facility] 2021/10 and [facility]. PHP at [facility] in 2020,She has had multiple medication trials including many  antidepressants, mood stabilizers, and antipsychotics [facility]       Substance Abuse History: Alcohol on weekends only. ??THC, smokes every other weekends. ??No programs. ??No other substances.     Social Hx: Pt lives with her parents. No kids and not in a relationship. 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  No hx of abuse. No legal issues and hs grad.     Family Mental Health Hx:   Mom, bipolar affective disorder, alcohol  use disorder. ??Sister OCD and learning disorder. ??No suicides.    Mental Status Examination:    Level of consciousness:  within normal limits and awake  Appearance:  well-appearing, street clothes, in chair, good grooming and good hygiene overweight  Behavior/Motor:  no abnormalities noted normal gait and station and normal balance AIMS: 0  Attitude toward examiner:  cooperative, attentive and good eye contact  Speech:  spontaneous, normal rate and normal volume Mood:  better Affect:  anxious Hallucinations: Absent Thought processes:  linear  Attention: attention span and concentration were age appropriate Thought content:  Reality based no evidence of delusions Abstraction: inatct  OCD: none   Insight: afir Judgement: impaired judgment  Cognition:  oriented to person, place, and time  Fund of Knowledge: average   IQ: average Memory: intact  Suicide:  no specific plan to harm self Sleep: no sleep issues Appetite: ok   Inventory of strengths and weaknesses:Family support  ROS:  See Medical H&PE    PE:  BP 118/62    Pulse 80    Temp 98 ??F (36.7 ??C) (Temporal)    Resp 16  SpO2 97%     Cranial Nerves: 1-12 appear to be intact .  LAB:   Admission on 02/05/2020, Discharged on 02/07/2020   Component Date Value Ref Range Status   ??? Cholesterol, Total 02/06/2020 182  0 - 199 mg/dL Final   ??? Triglycerides 02/06/2020 121  0 - 150 mg/dL Final   ??? HDL 02/06/2020 39* 40 - 60 mg/dL Final   ??? LDL Calculated 02/06/2020 119* <100 mg/dL Final   ??? VLDL Cholesterol Calculated 02/06/2020 24  Not Established mg/dL Final   ??? Hemoglobin A1C 02/06/2020 4.8  See comment % Final    Comment: Comment:  Diagnosis of Diabetes: > or = 6.5%  Increased risk of diabetes (Prediabetes): 5.7-6.4%  Glycemic Control: Nonpregnant Adults: <7.0%                    Pregnant: <6.0%       ??? eAG  02/06/2020 91.1  mg/dL Final   ??? TSH 02/06/2020 1.16  0.27 - 4.20 uIU/mL Final   ??? Sodium 02/06/2020 140  136 - 145 mmol/L Final   ??? Potassium reflex Magnesium 02/06/2020 4.7  3.5 - 5.1 mmol/L Final   ??? Chloride 02/06/2020 105  99 - 110 mmol/L Final   ??? CO2 02/06/2020 23  21 - 32 mmol/L Final   ??? Anion Gap 02/06/2020 12  3 - 16 Final   ??? Glucose 02/06/2020 97  70 - 99 mg/dL Final   ??? BUN 02/06/2020 13  7 - 20 mg/dL Final   ??? CREATININE 02/06/2020 0.8  0.6 - 1.1 mg/dL Final   ??? GFR Non-African American 02/06/2020 >60  >60 Final    Comment: >60 mL/min/1.35m EGFR, calc. for ages 152and older using the  MDRD formula (not corrected for weight), is valid for stable  renal function.     ??? GFR African American 02/06/2020 >60  >60 Final    Comment: Chronic Kidney Disease: less than 60 ml/min/1.73 sq.m.          Kidney Failure: less than 15 ml/min/1.73 sq.m.  Results valid for patients 18 years and older.     ??? Calcium 02/06/2020 9.8  8.3 - 10.6 mg/dL Final         Formulation: Pt od on seroquel secondary to multiple psychosocial stressors and poor coping skills    Dx: axis I:mdd recurrent moderate, cannabis abuse  Axis 2: Borderline Personality Traits   Avis 3: See Medical History  lAxis 4: Problems with primary support group, Educational problems and Other psychosocial and environmental problems      Tx plan:    prevent self injury, stabilize affect, restore sleep, treat depression,  establish/maintain aftercare.  All conditions present on admission are being treated while pt is hospitalized.     Medications  No current facility-administered medications for this encounter.     Current Outpatient Medications   Medication Sig Dispense Refill   ??? ARIPiprazole (ABILIFY) 10 MG tablet Take 1 tablet by mouth nightly 30 tablet 0   ??? citalopram (CELEXA) 40 MG tablet Take 40 mg by mouth daily     ??? topiramate (TOPAMAX) 50 MG tablet Take 50 mg by mouth daily          PRN Meds:    Estimated length of stay: 1-2 days  Prognosis:  good    Criteria for Discharge:    Not suicidal, sleeping well, affect stable, depression improving, eating well, aftercare arranged.     History and Physical Dictated  Spent at least 70 minutes with evaluation process and more than 50% of the time was spent obtaining history and planning treatment with the patient  Dx:

## 2020-02-06 NOTE — Other (Signed)
Purposeful Rounding    Patient Location: Day room    Patient willing to engage in conversation: Yes    Presentation/behavior: Withdrawn    Affect: Brightens with interaction    Concerns reported: no    PRN medications given: no    Environmental assessment: No safety hazards noted    Fall prevention interventions in place: Lighting appropriate and Clear path to bathroom    Daily Edmonson Fall Risk Score: 65    Daily Morse Fall Risk Score: 0      Electronically signed by Dory Peru, RN on 02/06/20 at 9:09 AM EST

## 2020-02-06 NOTE — H&P (Signed)
Hospital Medicine History & Physical      PCP: Thana Farr, MD    Date of Admission: 02/05/2020    Date of Service: Pt seen/examined on 02/06/20     Chief Complaint:  SI      History Of Present Illness:      The patient is a 25 y.o. female with depression who presented to [facility]  for SI, intentional overdose.  Patient was seen and evaluated in the ED by the ED medical provider, patient was medically cleared for admission to [facility] at [facility].  This note serves as an admission medical H&P.    Tobacco use: denies   ETOH use: occasionally   Illicit drug use: cannabis in the past UDS negative     Patient denies any medical complaints     Past Medical History:        Diagnosis Date   ??? Bipolar 1 disorder (HCC)    ??? Depression        Past Surgical History:    History reviewed. No pertinent surgical history.    Medications Prior to Admission:    Prior to Admission medications    Medication Sig Start Date End Date Taking? Authorizing Provider   ARIPiprazole (ABILIFY) 10 MG tablet Take 1 tablet by mouth nightly 12/04/19   Lennox Pippins, MD   citalopram (CELEXA) 40 MG tablet Take 40 mg by mouth daily    Historical Provider, MD   topiramate (TOPAMAX) 50 MG tablet Take 50 mg by mouth daily    Historical Provider, MD       Allergies:  Patient has no known allergies.    Social History:  The patient currently lives home     TOBACCO:   reports that she has never smoked. She has never used smokeless tobacco.  ETOH:   reports current alcohol use.      Family History:   Positive as follows:    History reviewed. No pertinent family history.    REVIEW OF SYSTEMS:       Constitutional: Negative for fever   HENT: Negative for sore throat   Eyes: Negative for redness   Respiratory: Negative  for dyspnea, cough   Cardiovascular: Negative for chest pain   Gastrointestinal: Negative for vomiting, diarrhea   Genitourinary: Negative for hematuria   Musculoskeletal: Negative for arthralgias   Skin: Negative for rash   Neurological: Negative for  syncope    Hematological: Negative for easy bruising/bleeding   Psychiatric/Behavorial: Per psychiatry team evaluation     PHYSICAL EXAM:    BP 111/73    Pulse 85    Temp 98.4 ??F (36.9 ??C) (Temporal)    Resp 16    SpO2 96%     Gen: No distress. Alert.   Eyes: PERRL. No sclera icterus. No conjunctival injection.   ENT: No discharge. Pharynx clear.   Neck: No JVD.  No Carotid Bruit. Trachea midline.  Resp: No accessory muscle use. No crackles. No wheezes. No rhonchi.   CV: Regular rate. Regular rhythm. No murmur.  No rub. No edema.   GI: Non-tender. Non-distended. Normal bowel sounds.   Skin: Warm and dry. No nodule on exposed extremities. No rash on exposed extremities.   M/S: No cyanosis. No joint deformity. No clubbing.   Neuro: Awake. No focal neurologic deficit on exam.  Cranial nerves II through XII intact.  Patient is able to ambulate without difficulty.  Psych: Per psychiatry team evaluation     CBC:   Recent Labs  02/05/20  0154   WBC 9.7   HGB 14.8   HCT 42.3   MCV 88.5   PLT 324     BMP:   Recent Labs     02/05/20  0154 02/06/20  0545   NA 140 140   K 3.3* 4.7   CL 101 105   CO2 26 23   BUN 16 13   CREATININE 0.8 0.8     LIVER PROFILE:   Recent Labs     02/05/20  0154   AST 16   ALT 17   BILIDIR <0.2   BILITOT 0.3   ALKPHOS 83     UA:  Recent Labs     02/05/20  0154 02/05/20  0154 02/05/20  0949   COLORU Yellow  --   --    PHUR 5.5   < > 5.5   WBCUA 6-9*  --   --    RBCUA 0-2  --   --    MUCUS 1+*  --   --    BACTERIA 1+*  --   --    CLARITYU Clear  --   --    SPECGRAV >=1.030  --   --    LEUKOCYTESUR Negative  --   --    UROBILINOGEN 0.2  --   --    BILIRUBINUR Negative  --   --    BLOODU TRACE-INTACT*  --   --    GLUCOSEU Negative  --   --    AMORPHOUS 1+  --   --     < > = values in this interval not displayed.        U/A:    Lab Results   Component Value Date    COLORU Yellow 02/05/2020    WBCUA 6-9 02/05/2020    RBCUA 0-2 02/05/2020    MUCUS 1+ 02/05/2020    BACTERIA 1+ 02/05/2020    CLARITYU Clear  02/05/2020    SPECGRAV >=1.030 02/05/2020    LEUKOCYTESUR Negative 02/05/2020    BLOODU TRACE-INTACT 02/05/2020    GLUCOSEU Negative 02/05/2020    AMORPHOUS 1+ 02/05/2020       CULTURES  SARS-CoV-2, NAAT Not Detected Not Detected          RADIOLOGY  No orders to display       Pertinent previous results reviewed   None     ASSESSMENT/PLAN:  SI  - per psychiatry team    Intentional Overdose  - patient took 10, 300 mg Seroquel in attempt to harm herself   - poison control consulted in ED  - observed in ED for 12 hours and medically cleared via ED MD for admission to [facility]      Hypokalemia  - noted in ED 3.3   - will repeat today and if remains low, replace    Obesity  - BMI 38.74  - Recommended weight loss     Pt has no medical complaints at this time. They were informed that should a medical concern arise during their admission they may have [facility] contact us.        Colon Flattery, APRN - CNP   02/06/2020

## 2020-02-06 NOTE — Plan of Care (Signed)
Patient visible on unit and attended group. No scheduled meds. Denies SI/HI/AVH CFS No behavioral issues Will continue to monitor.

## 2020-02-06 NOTE — Group Note (Signed)
Group Therapy Note    Date: 02/06/2020    Group Start Time: 4:00 PM  Group End Time: 4:30 PM  Group Topic: Relaxation    [facility]    Vieno Tarrant, LSW        Group Therapy Note    Attendees: 8    Patient's Goal: Participate in relaxation group for 20 minutes.     Notes:  Pt was appropriate and participated in group.     Status After Intervention:  Improved    Participation Level: Active Listener and Interactive    Participation Quality: Appropriate, Attentive, Sharing and Supportive      Speech:  normal      Thought Process/Content: Logical  Linear      Affective Functioning: Congruent      Mood: euthymic      Level of consciousness:  Alert, Oriented x4 and Attentive      Response to Learning: Able to verbalize current knowledge/experience, Able to verbalize/acknowledge new learning and Progressing to goal      Endings: None Reported    Modes of Intervention: Activity and Movement      Discipline Responsible: Social Worker/Counselor      Signature:  Carlyann Placide, LSW

## 2020-02-07 LAB — HEMOGLOBIN A1C
Hemoglobin A1C: 4.8 %
eAG: 91.1 mg/dL

## 2020-02-07 LAB — LIPID PANEL
Cholesterol, Total: 182 mg/dL (ref 0–199)
HDL: 39 mg/dL — ABNORMAL LOW (ref 40–60)
LDL Calculated: 119 mg/dL — ABNORMAL HIGH (ref <100)
Triglycerides: 121 mg/dL (ref 0–150)
VLDL Cholesterol Calculated: 24 mg/dL

## 2020-02-07 MED FILL — ARIPIPRAZOLE 15 MG PO TABS: 15 mg | ORAL | Qty: 1

## 2020-02-07 MED FILL — CITALOPRAM HYDROBROMIDE 20 MG PO TABS: 20 mg | ORAL | Qty: 2

## 2020-02-07 NOTE — Discharge Instructions (Signed)
Name of Provider: [facility]  Provider specialty/license: Ileana Ladd, psychiatry   Date and time of appointment: CALL [phone]  The types of services requested are: IOP, Individual therapy, [place], Psychiatry  Agency name: [facility]  Address: [address], [place], [place] [address]  Phone number: [phone]  Fax: [phone]    Name of Provider: [facility]  Provider specialty/license: LPCC/LISW  Date and time of appointment: Walk ins welcome 24/7  The types of services requested are: Intensive outpatient program  Agency name: [facility]  Address: [address] [place], [place] [address]  Phone number: [phone]  Fax: [phone]    The crisis number for [facility] is 281-CARE. This crisis line is available 24 hours a day, seven days a week.

## 2020-02-07 NOTE — Group Note (Signed)
Group Therapy Note    Date: 02/07/2020    Group Start Time: 9:30 AM  Group End Time: 10:00 AM  Group Topic: Community Meeting    [facility]    [name], [place]        Group Therapy Note    Attendees: 8       Patient's Goal: Talk to doctor about discharge today and explore my options for follow up.    Notes:  Pt was appropriate and participated in group.     Status After Intervention:  Improved    Participation Level: Active Listener and Interactive    Participation Quality: Appropriate, Attentive, Sharing and Supportive      Speech:  normal      Thought Process/Content: Logical  Linear      Affective Functioning: Congruent      Mood: euthymic      Level of consciousness:  Alert, Oriented x4 and Attentive      Response to Learning: Progressing to goal      Endings: None Reported    Modes of Intervention: Socialization      Discipline Responsible: Social Worker/Counselor      Signature:  [name], LSW

## 2020-02-07 NOTE — Behavioral Health Treatment Team (Signed)
Bridge Appointment completed: Reviewed Discharge Instructions with patient.    Patient verbalizes understanding and agreement with the discharge plan using the teachback method.     Referral for Outpatient Tobacco Cessation Counseling, upon discharge (mark X if applicable and completed):    ( )  Hospital staff assisted patient to call Quit Line or faxed referral                                   during hospitalization                  ( )  Recognizing danger situations (included triggers and roadblocks), if not completed on admission                    ( )  Coping skills (new ways to manage stress, exercise, relaxation techniques, changing routine, distraction), if not completed on admission                                                           ( )  Basic information about quitting (benefits of quitting, techniques in how to quit, available resources, if not completed on admission  ( ) [facility]   ( ) Patient refused referral  ( ) Patient refused counseling  ( X) Patient refused smoking cessation medication upon discharge    Vaccinations (mark X if applicable and completed):  ( ) Patient states already received influenza vaccine elsewhere  ( ) Patient received influenza vaccine during this hospitalization  ( X) Patient refused influenza vaccine at this time

## 2020-02-07 NOTE — Plan of Care (Signed)
Pt is alert and oriented X4. She is bright and pleasant with staff and peers. She denies SI/HI/AVH. Her thought content is linear and her mood is euthymic. She is looking forward to discharge home today.

## 2020-02-07 NOTE — Discharge Summary (Signed)
Discharge Summary   Admit Date: 02/05/2020   Discharge Date:  02/07/2020      Spent over 40 minutes with patient and staff on [place]   Final Dx:   axis I:mdd recurrent moderate, cannabis abuse  Axis 2: Borderline Personality Traits   Avis 3: See Medical History  lAxis 4: Problems with primary support group, Educational problems and Other psychosocial and environmental problems      Condition on DC  Mood and affect are stable and pt is not suicidal   VITALS:  BP 118/62    Pulse 80    Temp 98 ??F (36.7 ??C) (Temporal)    Resp 16    SpO2 97%   Brief Summary Present Illness   25 y/o wf with hx of depression that presented to ed after od on mom's seroquel. Pt stated that it was an impulsive act and she regrets taking the od. Pt stated that she has been stressing about cosmetology school and letting her parents down. Pt stated that she does not like it and wishes not to pursue it and stated that she enrolled to the school as an impulsive decision. 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 Pt stated that she feels anxious about been here in the hospital. Pt stated that she has been taking celexa and abilify and she feels meds are working for him. Pt stated that she needs to developed coping skills in order not to act impulsively. No psychosis and no mania reported.   ??    Hospital Course Pt was admitted after od on mom's seroquel. Pt was regertful after the od and told mom immediately after. Pt has poor coping skills and due to characterological structure acts impulsively. Pt was restarted on celexa and abilify and she reports meds are working for her. Pt attended gps and was social in the milieu. Pt has uneventful hospital course and she is not si and not holdable or probateable. Patient appears to be in stable condition and close to their baseline functioning.  The patient denies suicidal or homicidal ideations and is showing future orientation.  Patient no longer presented an  imminent risk of danger to themselves and/or others. At the time of discharge it appears that the patient has received the maximum medical benefit from this hospitalization and can be appropriately managed with community treatment.       PE: (reviewed) and labs (see medical H&PE)  Labs:    Admission on 02/05/2020, Discharged on 02/07/2020   Component Date Value Ref Range Status   ??? Cholesterol, Total 02/06/2020 182  0 - 199 mg/dL Final   ??? Triglycerides 02/06/2020 121  0 - 150 mg/dL Final   ??? HDL 02/06/2020 39* 40 - 60 mg/dL Final   ??? LDL Calculated 02/06/2020 119* <100 mg/dL Final   ??? VLDL Cholesterol Calculated 02/06/2020 24  Not Established mg/dL Final   ??? Hemoglobin A1C 02/06/2020 4.8  See comment % Final    Comment: Comment:  Diagnosis of Diabetes: > or = 6.5%  Increased risk of diabetes (Prediabetes): 5.7-6.4%  Glycemic Control: Nonpregnant Adults: <7.0%                    Pregnant: <6.0%       ??? eAG 02/06/2020 91.1  mg/dL Final   ??? TSH 02/06/2020 1.16  0.27 - 4.20 uIU/mL Final   ??? Sodium 02/06/2020 140  136 - 145 mmol/L Final   ???  Potassium reflex Magnesium 02/06/2020 4.7  3.5 - 5.1 mmol/L Final   ??? Chloride 02/06/2020 105  99 - 110 mmol/L Final   ??? CO2 02/06/2020 23  21 - 32 mmol/L Final   ??? Anion Gap 02/06/2020 12  3 - 16 Final   ??? Glucose 02/06/2020 97  70 - 99 mg/dL Final   ??? BUN 02/06/2020 13  7 - 20 mg/dL Final   ??? CREATININE 02/06/2020 0.8  0.6 - 1.1 mg/dL Final   ??? GFR Non-African American 02/06/2020 >60  >60 Final    Comment: >60 mL/min/1.70m EGFR, calc. for ages 133and older using the  MDRD formula (not corrected for weight), is valid for stable  renal function.     ??? GFR African American 02/06/2020 >60  >60 Final    Comment: Chronic Kidney Disease: less than 60 ml/min/1.73 sq.m.          Kidney Failure: less than 15 ml/min/1.73 sq.m.  Results valid for patients 18 years and older.     ??? Calcium 02/06/2020 9.8  8.3 - 10.6 mg/dL Final        Mental Status Exam at Discharge:  Level of consciousness:   awake  Appearance:  well-appearing, in chair, good grooming and good hygiene well-developed, well-nourished  Behavior/Motor:  no abnormalities noted normal gait and station AIMS: 0  Attitude toward examiner:  cooperative, attentive and good eye contact  Speech:  spontaneous, normal rate, normal volume and well articulated  Mood:  dysthymic  Affect:  mood congruent Anxiety: mild  Hallucinations: Absent  Thought processes:  coherent Attention span, Concentration & Attention:  attention span and concentration were age appropriate  Thought content:  Reality based no evidence of delusions OCD: none    Insight: normal insight and judgment Cognition:  oriented to person, place, and time  Fund of Knowledge: average  IDX:IPJASNKMemory: intact  Suicide:  No specific plan to harm self  Sleep: sleeps through the night  Appetite: ok   Reassess Sui Risk:  no specific plan to harm self Pt has phone numbers to contact if suicidal thoughts recur and states pt will return to the hospital if suicidal feelings return.   Hospital Routine Meds:     Hospital PRN Meds:    Discharge Meds:    Discharge Medication List as of 02/07/2020 12:39 PM           Details   ARIPiprazole (ABILIFY) 10 MG tablet Take 1 tablet by mouth nightly, Disp-30 tablet, R-0Normal      citalopram (CELEXA) 40 MG tablet Take 40 mg by mouth dailyHistorical Med      topiramate (TOPAMAX) 50 MG tablet Take 50 mg by mouth dailyHistorical Med                 Disposition - Residence      Follow Up:  See Discharge Instructions

## 2020-02-07 NOTE — Behavioral Health Treatment Team (Signed)
[facility]  Discharge Note    Pt discharged with followings belongings:   Dental Appliances: None  Vision - Corrective Lenses: Glasses  Hearing Aid: None  Jewelry: Ring,Bracelet,Earrings  Body Piercings Removed: N/A  Clothing: Footwear,Pants,Shirt  Were All Patient Medications Collected?: Not Applicable  Other Valuables: None (hat)   Valuables sent home with patient. Patient education on aftercare instructions: Yes Information faxed to [facility] by Alona Bene, RN  at 1:08 PM .Patient verbalize understanding of AVS: Yes .    Status EXAM upon discharge:  Status and Exam  Normal: Yes  Facial Expression: Brightened  Affect: Appropriate  Level of Consciousness: Alert  Mood:Normal: Yes  Mood: Sad  Motor Activity:Normal: Yes  Interview Behavior: Cooperative  Preception: Orient to Person,Orient to Time,Orient to Place,Orient to Situation  Attention:Normal: Yes  Thought Processes: Circumstantial  Thought Content:Normal: Yes  Hallucinations: None  Delusions: No  Memory:Normal: Yes  Insight and Judgment: No  Insight and Judgment: Poor Insight  Present Suicidal Ideation: No  Present Homicidal Ideation: No      Metabolic Screening:    Lab Results   Component Value Date    LABA1C 4.8 02/06/2020       Lab Results   Component Value Date    CHOL 182 02/06/2020     Lab Results   Component Value Date    TRIG 121 02/06/2020     Lab Results   Component Value Date    HDL 39 (L) 02/06/2020     No components found for: [facility]  Lab Results   Component Value Date    LABVLDL 24 02/06/2020       Tyrell Antonio, RN

## 2020-02-07 NOTE — Care Coordination-Inpatient (Signed)
[facility]  Treatment Team Note  Review Date & Time:   02/06/19    Patient was not in treatment team      Status EXAM:   Status and Exam  Normal: Yes  Facial Expression: Brightened  Affect: Appropriate  Level of Consciousness: Alert  Mood:Normal: Yes  Mood: Sad  Motor Activity:Normal: Yes  Interview Behavior: Cooperative  Preception: Orient to Person,Orient to Time,Orient to Place,Orient to Situation  Attention:Normal: Yes  Thought Processes: Circumstantial  Thought Content:Normal: Yes  Hallucinations: None  Delusions: No  Memory:Normal: No  Insight and Judgment: No  Insight and Judgment: Poor Judgment,Poor Insight  Present Suicidal Ideation: No  Present Homicidal Ideation: No      Suicide Risk CSSR-S:         PLAN/TREATMENT RECOMMENDATIONS UPDATE: Patient will take medication as prescribed, eat 75% of meals, attend groups, participate in milieu activities, participate in treatment team and care planning for discharge and follow up.            Marta Lamas, RN

## 2020-02-07 NOTE — Group Note (Signed)
Group Therapy Note    Date: 02/07/2020    Group Start Time: 10:05 AM  Group End Time: 11:00 AM  Group Topic: Relaxation    [facility]    [name], LSW        Group Therapy Note    Attendees: 8       Patient's Goal: Pt will create illustration of their "safe space" to promote relaxation.    Notes: Pt participated in group activity.     Status After Intervention:  Improved    Participation Level: Active Listener and Interactive    Participation Quality: Appropriate, Attentive, Sharing and Supportive      Speech:  normal      Thought Process/Content: Logical  Linear      Affective Functioning: Congruent      Mood: euthymic      Level of consciousness:  Alert, Oriented x4 and Attentive      Response to Learning: Able to verbalize current knowledge/experience, Able to verbalize/acknowledge new learning and Progressing to goal      Endings: None Reported    Modes of Intervention: Activity      Discipline Responsible: Social Worker/Counselor      Signature:  [name], LSW

## 2021-04-20 NOTE — Progress Notes (Signed)
Formatting of this note is different from the original.  Chief Complaint   Patient presents with   ? IUD     Patient is here today for IUD Mirena insertion.    ? Pregnancy Test     Negative      I have added an The Mirena was inserted by Armando Gang CNM    Lot number [id number]  Expiration Date 01/30/2023  NDC Number  Mirena (NDC# 45409-811-91)  Consent form signed Yes    Tasha Buckley is a 26 year old female here today for Mirena intrauterine device (IUD) insertion.  She has been previously counseled.  I again counseled her.  She is able to voice understanding, consents to proceed with IUD insertion.    Patient Active Problem List   Diagnosis   ? BMI 45.0-49.9, adult (HCC)   ? Irregular menstrual cycle     Past Medical History:   Diagnosis Date   ? Anxiety    ? Depression      Past Surgical History:   Procedure Laterality Date   ? MIRENA IUD  04/20/2021    insertion     OB History   Gravida Para Term Preterm AB Living   0 0 0 0 0 0   SAB IAB Ectopic Multiple Live Births   0 0 0 0 0     No Known Allergies  Outpatient Medications Marked as Taking for the 04/20/21 encounter (Office Visit) with Valeda Malm Discepoli, CNM   Medication Sig   ? levonorgestrel (MIRENA) 20 mcg/day IUD intrauterine device 1 Intra Uterine Device by Intrauterine route once.     Social History     Socioeconomic History   ? Marital status: Single     Spouse name: Not on file   ? Number of children: Not on file   ? Years of education: Not on file   ? Highest education level: Not on file   Occupational History   ? Not on file   Tobacco Use   ? Smoking status: Never   ? Smokeless tobacco: Never   Substance and Sexual Activity   ? Alcohol use: Yes     Comment: socially   ? Drug use: Yes     Types: Marijuana   ? Sexual activity: Yes     Partners: Male     Birth control/protection: I.U.D.     Comment: Mirena 04/20/2021   Other Topics Concern   ? Not on file   Social History Narrative   ? Not on file     Social Determinants of Health      Financial Resource Strain: Not on file   Food Insecurity: Not on file   Transportation Needs: Not on file   Physical Activity: Not on file   Stress: Not on file   Social Connections: Not on file   Intimate Partner Violence: Not on file   Housing Stability: Not on file     Family History   Problem Relation Age of Onset   ? Diabetes Father    ? High BP Father    ? Stroke Maternal Grandmother    ? Stroke Paternal Grandfather    ? Cancer Neg Hx    ? High Cholesterol Neg Hx    ? Thyroid Disease Neg Hx    ? Blood Disorder Neg Hx    ? Heart Disease Neg Hx      OBJECTIVE:  BP 118/89   Pulse 80   Ht 66 (167.6  cm)   Wt 281 lb (127.5 kg)   LMP 04/16/2021   BMI 45.35 kg/m   Patient's last menstrual period was 04/16/2021.  MOOD and AFFECT: Appropriate,calm.  JUDGEMENT and INSIGHT: Intact.  Pelvic Exam:  Vulva:  no external lesions,normal hair distribution  Vagina:  moist,pink,well rugated,no discharge  Cervix:  smooth,pink,no lesions.    ICON neg    PROCEDURE:  Speculum placed & cervix visualized. Betadine swabbed times three to os and cervix. Allis clamp applied to cervix at anterior lip. Uterus sounded to 7 cms and Mirena IUD inserted without difficulty per device protocol.  Allis clamp removed and cervix appeared hemostatic. Strings clipped to ~2 cms from os.     History and Examination chaperoned by Medical Assistant    A/P:  Encounter for insertion of intrauterine contraceptive device    Mirena IUD insertion today.  No complications were experienced during or post-insertion and the patient tolerated insertion very well.    Instructions given to patient regarding checking IUD strings and anticipated vaginal bleeding.   Return to office in 4 weeks for string check.   Return to office/call sooner for fever, abdominal pain, chills, nausea, heavy vaginal bleeding.   Patient voices understanding.    Electronically signed by Armando Gang, CNM at 04/21/2021  9:30 AM EDT

## 2021-04-20 NOTE — Progress Notes (Signed)
Formatting of this note might be different from the original.  10:39 AM  [phone] (home) [phone] (Mobile)   THIS PATIENT HAS BEEN DISMISSED FROM [facility] PATIENT HAS TRANSFERRED CARE. 11/04/2014   Verified with pt correct numbers above    Electronically signed by Rosemary Holms, LPN at 29/56/2130  9:30 AM EDT

## 2022-08-13 NOTE — Progress Notes (Signed)
Subjective  Patient ID: Tasha Buckley is a 27 y.o. female.    Chief Complaint   Patient presents with    Mouth or throat complaint         HPI   Patient presents with sore throat that began yesterday. She works at a daycare. Denies fevers, chills, breathing difficulties. Took iburpofen with some relief. Recent amoxicillin use x1 week that she stopped 1 week ago for root canal.     Mouth or throat complaint  Primary Symptom: Sore Throat/Mouth Problem    Duration of current symptoms:  2 days  Onset quality:   suddenly    Associated symptoms: cough and sore throat    Associated symptoms: no abdominal pain, no myalgias, no chills, no nasal congestion, no appetite change, no drooling, no ear pain, no facial swelling, no fatigue, no fever, no headaches, no mouth sores, no neck stiffness, no rash, no rhinorrhea, no diaphoresis, no swollen glands, no chest pain, no chest tightness, no shortness of breath, no sinus pain, no sneezing, no wheezing, no nausea, no diarrhea, no vomiting, no constipation and no trouble swallowing    Worsened by:  None  Red flag features: no muffled or 'hot potato' voice, no hoarseness, no drooling, no stridor, no respiratory distress and no 'sniffing' or 'tripod' positions    Special considerations: recent antibiotic use             Review of Systems   Constitutional:  Negative for appetite change, chills, diaphoresis, fatigue and fever.   HENT:  Positive for sore throat. Negative for congestion, drooling, ear pain, facial swelling, mouth sores, rhinorrhea, sinus pressure, sinus pain, sneezing, trouble swallowing and voice change.    Respiratory:  Positive for cough. Negative for apnea, choking, chest tightness, shortness of breath, wheezing and stridor.    Cardiovascular:  Negative for chest pain, palpitations and leg swelling.   Gastrointestinal:  Negative for abdominal distention, abdominal pain, constipation, diarrhea, nausea and vomiting.   Musculoskeletal:  Negative for myalgias and  neck stiffness.   Skin:  Negative for rash.   Neurological:  Negative for dizziness, light-headedness and headaches.       Social History     Tobacco Use   Smoking Status Never    Passive exposure: Never   Smokeless Tobacco Never     Past Medical History:   Diagnosis Date    Anxiety     Depression      History reviewed. No pertinent surgical history.  No family history on file.    Objective        Physical Exam  Constitutional:       General: She is awake. She is not in acute distress.     Appearance: Normal appearance. She is not ill-appearing, toxic-appearing or diaphoretic.   HENT:      Head: Normocephalic and atraumatic.      Right Ear: Hearing and tympanic membrane normal. No drainage, swelling or tenderness. No middle ear effusion. There is no impacted cerumen. No mastoid tenderness. Tympanic membrane is not injected, scarred, perforated, erythematous, retracted or bulging.      Left Ear: Hearing and tympanic membrane normal. No drainage, swelling or tenderness.  No middle ear effusion. There is no impacted cerumen. No mastoid tenderness. No hemotympanum. Tympanic membrane is not injected, scarred, perforated, erythematous, retracted or bulging.      Nose: Nose normal.      Right Sinus: No maxillary sinus tenderness or frontal sinus tenderness.      Left  Sinus: No maxillary sinus tenderness or frontal sinus tenderness.      Mouth/Throat:      Pharynx: Uvula midline. Posterior oropharyngeal erythema present. No pharyngeal swelling, oropharyngeal exudate, uvula swelling or postnasal drip.      Tonsils: No tonsillar exudate or tonsillar abscesses. 2+ on the right. 2+ on the left.   Neck:      Trachea: Phonation normal.   Pulmonary:      Effort: Pulmonary effort is normal.      Breath sounds: Normal breath sounds and air entry. No stridor, decreased air movement or transmitted upper airway sounds.   Musculoskeletal:      Cervical back: Normal range of motion.   Lymphadenopathy:      Head:      Right side of  head: No submental or submandibular adenopathy.      Left side of head: No submental or submandibular adenopathy.      Cervical: Cervical adenopathy present.      Right cervical: Posterior cervical adenopathy present. No superficial or deep cervical adenopathy.     Left cervical: Posterior cervical adenopathy present. No superficial or deep cervical adenopathy.   Skin:     General: Skin is warm.      Capillary Refill: Capillary refill takes less than 2 seconds.   Neurological:      Mental Status: She is alert.   Psychiatric:         Behavior: Behavior is cooperative.          Centor Score Total: 3    Assessment  HPI provided by Self    Based on today's visit:history, physical exam and all relevant testing completed in clinic today  patient's visit diagnosis is/includes   1. Acute pharyngitis, unspecified etiology    2. Need for vaccination      Patient does not have a history of chronic conditions.    Treatment plan includes:   Plan  Orders Placed:  Orders Placed This Encounter   Procedures    GSK Boostrix (Tdap) 0.28mL PFS IM; Without Preservative    Strep Molecular POCT     Medications ordered this visit      No prescriptions requested or ordered in this encounter       Current medication list and any new medications prescribed or recommended today were reviewed with the patient and specific instructions were provided Yes    Provider Recommendations   1. Acute pharyngitis, unspecified etiology    - Strep Molecular POCT    2. Need for vaccination    - GSK Boostrix (Tdap) 0.45mL PFS IM; Without Preservative     Follow up care instructions were provided and reviewed?with the  Patient. All questions were answered. Patient verbalized understanding of plan of care today.            I counseled this patient/guardian regarding vaccines. The counseling included a review of the patients medical and vaccine histories which was used as part of my medical decision making and determination of the patients vaccines needs.  Vaccine schedules reviewed with patient/guardian. At this time vaccines were deemed necessary. Patient/guardian agrees to plan of care.  Has the VIS been reviewed? Yes  Are you sick today with a moderate to severe illness? No Reason for today's vaccine routine vaccine Have you ever had a serious reaction to any vaccine in the past? No  Have you ever fainted, nearly fainted or been concerned about fainting after receiving an injection/ vaccine? No  Do you have  any concerns receiving a vaccine in either arm ( history of shoulder injury, mastectomy or other surgery?) N/A side/arm.          Tylenol or ibuprofen as needed for pain relief.  Pharyngitis Home Care  -Do not smoke.  -Avoid secondhand smoke and other air pollutants.   -Use a cool mist humidifier to add moisture to the air.   -Get plenty of rest. You may want to rest your throat by talking less and eating a diet that is mostly liquid or soft for a day or two.   -Nonprescription throat lozenges and mouthwashes should help relieve the soreness. Gargling with warm saltwater and drinking warm liquids may help. (You can make a saltwater solution by adding a half teaspoon of salt to 8 ounces of warm water.)   -A nonprescription pain reliever such as aspirin, acetaminophen, or ibuprofen may ease general aches and pains. Children under 36 years of age should not take aspirin or products containing salicylate (such as PeptoBismol) because of the risk of Reye's syndrome unless recommended by a health care provider     Subjective:    Patient ID: Tasha Buckley is a 27 y.o. female here for a Mouth or throat complaint visit.                                        Lifestyle: Shonte reports that she has never smoked. She has never been exposed to tobacco smoke. She has never used smokeless tobacco.     Objective:     Assessment/Plan:     Vaccine administered in accordance with [facility] guidelines.    Patient advised to contact [facility] if adverse event occurs.

## 2022-08-14 ENCOUNTER — Inpatient Hospital Stay: Admit: 2022-08-14 | Discharge: 2022-08-14 | Payer: PRIVATE HEALTH INSURANCE

## 2022-08-14 ENCOUNTER — Inpatient Hospital Stay
Admit: 2022-08-14 | Discharge: 2022-08-17 | Disposition: A | Discharge status: Home / Self Care | Payer: PRIVATE HEALTH INSURANCE

## 2022-08-14 DIAGNOSIS — R45851 Suicidal ideations: Secondary | ICD-10-CM

## 2022-08-14 DIAGNOSIS — F332 Major depressive disorder, recurrent severe without psychotic features: Secondary | ICD-10-CM

## 2022-08-14 DIAGNOSIS — T39312A Poisoning by propionic acid derivatives, intentional self-harm, initial encounter: Secondary | ICD-10-CM

## 2022-08-14 LAB — BASIC METABOLIC PANEL
Anion Gap: 10 mmol/L (ref 3–16)
BUN: 8 mg/dL (ref 7–25)
CO2: 25 mmol/L (ref 21–33)
Calcium: 9.4 mg/dL (ref 8.6–10.3)
Chloride: 105 mmol/L (ref 98–110)
Creatinine: 0.82 mg/dL (ref 0.60–1.30)
EGFR: >90
Glucose: 103 mg/dL — ABNORMAL HIGH (ref 70–100)
Osmolality, Calculated: 289 mOsm/kg (ref 278–305)
Potassium: 3.8 mmol/L (ref 3.5–5.3)
Sodium: 140 mmol/L (ref 133–146)

## 2022-08-14 LAB — URINE DRUG SCREEN WITHOUT CONFIRMATION, STAT
Amphetamine, 500 ng/mL Cutoff: NEGATIVE
Barbiturates UR, 300  ng/mL Cutoff: NEGATIVE
Benzodiazepines UR, 300 ng/mL Cutoff: NEGATIVE
Buprenorphine, 5 ng/mL Cutoff: NEGATIVE
Cocaine UR, 300 ng/mL Cutoff: NEGATIVE
Fentanyl, 2 ng/mL Cutoff: NEGATIVE
Methadone, UR, 300 ng/mL Cutoff: NEGATIVE
Opiates UR, 300 ng/mL Cutoff: NEGATIVE
Oxycodone, 100 ng/mL Cutoff: NEGATIVE
THC UR, 50 ng/mL Cutoff: POSITIVE — AB
Tricyclic Antidepressants, 300 ng/mL Cutoff: NEGATIVE

## 2022-08-14 LAB — CBC
Hematocrit: 43.7 % (ref 35.0–45.0)
Hemoglobin: 14.8 g/dL (ref 11.7–15.5)
MCH: 29.6 pg (ref 27.0–33.0)
MCHC: 33.8 g/dL (ref 32.0–36.0)
MCV: 87.6 fL (ref 80.0–100.0)
MPV: 7.6 fL (ref 7.5–11.5)
Platelets: 369 10*3/uL (ref 140–400)
RBC: 4.99 10*6/uL (ref 3.80–5.10)
RDW: 13.3 % (ref 11.0–15.0)
WBC: 12.4 10*3/uL — ABNORMAL HIGH (ref 3.8–10.8)

## 2022-08-14 LAB — DIFFERENTIAL
Basophils Absolute: 62 /uL (ref 0–200)
Basophils Relative: 0.5 % (ref 0.0–1.0)
Eosinophils Absolute: 136 /uL (ref 15–500)
Eosinophils Relative: 1.1 % (ref 0.0–8.0)
Lymphocytes Absolute: 3224 /uL (ref 850–3900)
Lymphocytes Relative: 26 % (ref 15.0–45.0)
Monocytes Absolute: 682 /uL (ref 200–950)
Monocytes Relative: 5.5 % (ref 0.0–12.0)
Neutrophils Absolute: 8296 /uL — ABNORMAL HIGH (ref 1500–7800)
Neutrophils Relative: 66.9 % (ref 40.0–80.0)
nRBC: 0 /100 WBC (ref 0–0)

## 2022-08-14 LAB — HEPATIC FUNCTION PANEL
ALT: 16 U/L (ref 7–52)
AST: 16 U/L (ref 13–39)
Albumin: 4.5 g/dL (ref 3.5–5.7)
Alkaline Phosphatase: 60 U/L (ref 36–125)
Bilirubin, Direct: 0.1 mg/dL (ref 0.0–0.4)
Bilirubin, Indirect: 0.4 mg/dL (ref 0.0–1.1)
Total Bilirubin: 0.5 mg/dL (ref 0.0–1.5)
Total Protein: 7.3 g/dL (ref 6.4–8.9)

## 2022-08-14 LAB — SALICYLATE LEVEL: Salicylate Lvl: <3 mg/dL — ABNORMAL LOW (ref 10–30)

## 2022-08-14 LAB — ACETAMINOPHEN LEVEL: Acetaminophen Level: <10 ug/mL — ABNORMAL LOW (ref 10–30)

## 2022-08-14 LAB — ETHANOL, SERUM: Ethanol: <10 mg/dL (ref 0–10)

## 2022-08-14 LAB — HCG URINE, QUALITATIVE: hCG Qualitative -Clinitek: NEGATIVE

## 2022-08-14 MED ORDER — FLUoxetine (PROZAC) capsule 40 mg
20 | Freq: Every day | ORAL
Start: 2022-08-14 — End: 2022-08-14

## 2022-08-14 MED ORDER — ARIPiprazole (ABILIFY) tablet 10 mg
10 | Freq: Every day | ORAL
Start: 2022-08-14 — End: 2022-08-14

## 2022-08-14 MED ORDER — acetaminophen (TYLENOL) tablet 650 mg
325 | Freq: Four times a day (QID) | ORAL | Status: AC | PRN
Start: 2022-08-14 — End: 2022-08-17

## 2022-08-14 MED ORDER — sterile water (PF) Soln 2.1 mL
INTRAMUSCULAR | Status: AC | PRN
Start: 2022-08-14 — End: 2022-08-17

## 2022-08-14 MED ORDER — FLUoxetine (PROZAC) capsule 40 mg
20 | Freq: Every evening | ORAL | Status: AC
Start: 2022-08-14 — End: 2022-08-15
  Administered 2022-08-15 – 2022-08-16 (×2): via ORAL

## 2022-08-14 MED ORDER — traZODone (DESYREL) tablet 50 mg
50 | Freq: Every evening | ORAL | Status: AC | PRN
Start: 2022-08-14 — End: 2022-08-17

## 2022-08-14 MED ORDER — hydrOXYzine pamoate (VISTARIL) capsule 25 mg
25 | Freq: Three times a day (TID) | ORAL | Status: AC | PRN
Start: 2022-08-14 — End: 2022-08-17
  Administered 2022-08-15 – 2022-08-16 (×3): via ORAL

## 2022-08-14 MED ORDER — polyethylene glycol (MIRALAX) packet 17 g
17 | Freq: Every day | ORAL | Status: AC | PRN
Start: 2022-08-14 — End: 2022-08-17

## 2022-08-14 MED ORDER — olanzapine zydis (ZYPREXA) disintegrating tablet 5 mg
5 | ORAL | Status: AC | PRN
Start: 2022-08-14 — End: 2022-08-17

## 2022-08-14 MED ORDER — ARIPiprazole (ABILIFY) tablet 10 mg
10 | Freq: Every evening | ORAL | Status: AC
Start: 2022-08-14 — End: 2022-08-17
  Administered 2022-08-15 – 2022-08-17 (×3): via ORAL

## 2022-08-14 MED ORDER — OLANZapine (ZYPREXA) injection 10 mg
10 | INTRAMUSCULAR | Status: AC | PRN
Start: 2022-08-14 — End: 2022-08-17

## 2022-08-14 NOTE — Nursing Note (Signed)
Sitter at bedside. Please see close observation record. VSS. Patient ordered breakfast tray at this time. Will continue to monitor patient closely.

## 2022-08-14 NOTE — ED Notes (Signed)
Patient placed on a hold at this time.

## 2022-08-14 NOTE — Nursing Note (Signed)
Patient eating breakfast tray at this time. 

## 2022-08-14 NOTE — Plan of Care (Addendum)
Problem: Risk for Suicidal Behavior   Description: Tasha Buckley is at Risk for Suicidal Behaviors AEB suicide attempt by overdose on home medications.    C-SSRS:  HIGH    Goal: Goal 1:Long Term Goal  Description: Tasha Buckley will verbalize knowledge of self-destructive behavior(s), other psychiatric problems, and safe use of medication by time of discharge from [facility].  Pt states that her goal for admission is to feel better.  Outcome: Progressing    Tasha Buckley presents as calm and cooperative with a(n) responsive affect.  She rates anxiety 8/10 and depression 8/10.  Tasha Buckley denies SI, HI and AVH currently.  She was observed active in the milieu briefly, reading in her room, and visiting with family.  Tasha Buckley attended and engaged in 0 unit program(s).  She ate 90% of dinner.  She was medication compliant.  PRNs:  Vistaril 25 mg at 2201.      Tasha Buckley is able to verbalize that she will remain safe at this time.  Based on the Treatment guidelines and the professional opinion of the RN and treatment team, she may have her own clothes, standard linens and all silverware with meals.  [name] Risk Level = HIGH.    Daily Medication Education Questions:  Do you have questions about your medications? no  2.   Any questions about side effects and how to manage them? no  3.   Any questions on how you take your medications? no      [facility]   Inpatient Shift Assessment    Name: Tasha Buckley  MRN: [id number]  Admission date: 08/14/2022        Vitals:     Temp: 97.2 F (36.2 C)  Heart Rate: 68  BP: 111/56  MAP (mmHg): 68  BP Location: Right upper arm  BP Method: Automatic  Patient Position: Sitting  SpO2: 97 %  O2 Device: None (Room air)  Height: 5' 5 (165.1 cm)  Weight: (!) 259 lb (117.5 kg)  Weight Source: Standing Scale  BMI (Calculated): 43.2      Pain/ Pain Reassessment:     Pain Score: 0 - No Pain         Intake:     Percent Meals Eaten (%): 90  %      Output:            POCT Glucose:            [name] Suicide Severity Rating Scale - Frequent Screener:          Assigned Risk: High Risk  High Risk Interventions: 15 minute checks at irregular intervals;Reassess risk daily, upon status change and discharge;Pharmacological Treatment;Encourage attending DBT/CBT groups;Encourage to attend groups to learn coping skills, stress management, symptome management;Montior visitors to deter patient from obtaining harmful objects;Restrict patient to the unit        Mental Status Exam:     Apparent Age: Appears Actual Age  Hygiene/Grooming: Disheveled  General Attitude: Cooperative;Pleasant  Motor Activity: Unremarkable  Eye Contact: Appropriate  Facial Expression: Anxious  Patient Behaviors/Mood: Anxious;Cooperative  Impulsivity: Normal  Speech Pattern: Within Defined Limits  Mood: Anxious  Affect: Responsive  Affect congruent with mood: Yes  Content: Blaming self  Delusions: Within Defined Limits  Perception: Appropriate  Hallucination: None  Thought content appropriate to situation: Yes  Danger to Others (WDL): Within Defined Limits  Thought process: Appropriate  Memory Impairment: None  Cognition: Follows 2-step commands;Appropriate for developmental age  Orientation Level: Oriented X4  Insight: Average  Judgement: Average  Appetite Change: Decreased        [place] Fall Risk     Age: Less than 50  Mental Status: Fully Alert/ Oriented at all times  Elimination: Independent with control of bowel/ bladder  Medication: Psychotropic medications ( including benzos and antidepressants)  Psych Diagnosis: Major depressive disorder  Ambulation/ Balance: Independent/ Steady gait/ Immobile  Nutrition: No apparent abnormalities with appetite  Sleep Disturbance: No sleep disturbance  History of Falls: No history of falls  Secondary Diagnosis: No medical problems  [place] Fall Risk Score: 14             Patient Checks:     Interventions: Call bell within reach, ID band on,  Dayroom observation  Visual Checks: Standard Q15  Arm Bands On: ID  Patient Checked for Contraband: Belongings checked, Body checked, Clothing checked        Safety:     Depression rating: 8  Anxiety rating: 10  Self Injurious Thoughts: Intent and plan for self injurious actions (Comment)  Self Injurious Behaviors: None observed  Current Thoughts of Aggression: No  Elopement risk?: No  Methods to Calm Down: Reading;Quiet time in room;Change of environment        DASA     Irritablity: No  Verbal Threats: No  Impulsivity: No  Negative attitude: No  Unwillingness to follow direction: No  Sensitivity to perceived provocation: No  Easily angered when request denied: No  Total Score - DASA: 0      Withdrawl Symptoms:     Has Patient Abused Substances in the Past 7 Days?: No      Hygiene:            Nutrition Screen:              Tad Moore, RN  08/14/2022  10:44 PM

## 2022-08-14 NOTE — Group Note (Signed)
Group Note    Patient Name: Diasia Henken    Date: 08/14/2022      Time: 1700     Group Type: Cognitive Behavorial Therapy     Group Name: The Cognitive Model    Group Objective: Explanation to understand on how our thoughts determine our emotions which can lead to a behavior.      Attendance: Did not attend    Interactions: Did not interact    Mood/Affect: Unable to assess    Patient Participation: Keyundra Fant  did not attend this group d/t having a visitor during this time. Materials were offered and staff will inform of opportunity to ask questions.     Gertha Calkin, MHS

## 2022-08-14 NOTE — Group Note (Signed)
Group Note    Patient Name: Raelea Gosse    Date: 08/14/2022      Time: 1930     Group Type: Wrap Up/Sleep Hygiene    Group Name: Wrap Up Group    Group Objective: To review and discuss the events of the day/evening.  Discuss the importance of sleep and how to develop good sleep habits     Attendance: Did not attend    Interactions: Did not interact    Mood/Affect: Unable to assess    Patient Participation: Kamil Hanigan  did not attend this group and elected to rest in her room during this time instead. Materials were offered and staff will inform of opportunity to ask questions.     Gertha Calkin , MHS

## 2022-08-14 NOTE — ED Notes (Signed)
Patient reports that she took the pills aprox 20 minutes PTA.

## 2022-08-14 NOTE — ED Triage Notes (Signed)
Pt states that over the weekend she was feeling thoughts of wanting to harm herself and then tonight took 6-800mg  ibuprofen. Pt denies actively feeling suicidal, says she just feels sad. 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

## 2022-08-14 NOTE — Research Notes (Signed)
EIP NOTE         Visit with EIP LC prevented.   Pleasee see [facility] note.               EIP ON SITE #[id number]

## 2022-08-14 NOTE — ED Provider Notes (Signed)
[facility] ED Note    Date of Service: 08/14/2022    Reason for Visit: Overdose-Intentional (Pt reports she intentionally ingested 6-800mg  of ibuprofen. Pt states she doesn't know if she did it to harm herself, she thinks she is just sad)      Patient History     HPI:  Tasha Buckley is a 27 y.o. female with history of anxiety, self-harm behavior, depression who presents to the emergency department with complaints of overdose.  She said she took 6 tablets of 800 mg ibuprofen for about 30 minutes prior to arrival.  She said she did this because she was feeling sad, and attempted self-harm.  No other ingestions.  She did smoke marijuana earlier.  No alcohol.  Has been admitted multiple times to psychiatric facilities for similar behavior.  Now says her stomach hurts a little bit, otherwise feels okay.  Also admits to cutting her abdomen today with knife, no stab wounds.  There are no other alleviating or aggravating factors or associated symptoms other than that which has already been mentioned.       Past Medical History:   Diagnosis Date    Anxiety     Concussion     mild, age 46-18, fell on treadmill at gym    Depression     Mood swings        History reviewed. No pertinent surgical history.    Tasha Buckley  reports that she has never smoked. She has never used smokeless tobacco. She reports current alcohol use of about 10.0 standard drinks of alcohol per week. She reports that she does not use drugs.    Previous Medications    CITALOPRAM (CELEXA) 40 MG TABLET    Take 1 tablet (40 mg total) by mouth daily.    QUETIAPINE (SEROQUEL) 300 MG TABLET    Take 1 tablet (300 mg total) by mouth at bedtime.    TOPIRAMATE (TOPAMAX) 25 MG TABLET    Take 1 tablet (25 mg total) by mouth 2 times a day.       Allergies:   Allergies as of 08/14/2022    (No Known Allergies)       Review of Systems     ROS:  Pertinent positive and negative findings as documented in the HPI  otherwise all other systems were reviewed and were negative.    Physical Exam     ED Triage Vitals   Vital Signs Group      Temp       Temp src       Pulse       Heart Rate Source       Resp       SpO2       BP       MAP (mmHg)       BP Method       BP Location       BP Cuff Size       Patient Position    SpO2    O2 Device      Vitals:    08/14/22 0230   BP: 118/70   Pulse:    Resp:    Temp:    SpO2: 98%         General:  Alert, no distress    HEENT:  Head atraumatic, pupils equal round and reactive to light, extraocular movements intact, sclera clear, mucus membranes moist    Neck:  Supple, no LAD    Pulmonary:  CTAbilaterally, no wheezes, rhonchi, or rales    Cardiac:  RRR, normal S1S2, no MRG    Abdomen:  Soft, nontender, nondistended, no rebound and no guarding    Musculoskeletal:  No obvious deformities, no tenderness to palpation    Vascular:  2+ radial pulses bilaterally, 2+ dorsalis pedis pulses bilaterally    Skin:  Warm, dry, well perfused, no rashes, multiple linear superficial lacerations to abdomen    Neuro:  Alert and oriented x4, speech is clear and intact without dysarthria, gait intact, no deficits noted    Psych:  Alert, Cooperative, admits to recent ingestion for self-harm, denies ongoing SI, not attending to internal stimuli      Diagnostic Studies     Labs:    Labs Reviewed   BASIC METABOLIC PANEL - Abnormal; Notable for the following components:       Result Value    Glucose 103 (*)     All other components within normal limits   CBC - Abnormal; Notable for the following components:    WBC 12.4 (*)     All other components within normal limits   DIFFERENTIAL - Abnormal; Notable for the following components:    Neutrophils Absolute 8,296 (*)     All other components within normal limits   URINE DRUG SCREEN WITHOUT CONFIRMATION, STAT - Abnormal; Notable for the following components:    THC UR, 50 ng/mL Cutoff Presumptive Positive (*)     All other components within normal limits   ACETAMINOPHEN  LEVEL - Abnormal; Notable for the following components:    Acetaminophen Level <10 (*)     All other components within normal limits   SALICYLATE LEVEL - Abnormal; Notable for the following components:    Salicylate Lvl <3 (*)     All other components within normal limits   HEPATIC FUNCTION PANEL   HCG URINE, QUALITATIVE   ETHANOL, SERUM       Radiology:    No orders to display       No orders to display         EKG: Normal sinus rhythm, rate 80, QTc 463, T wave inversion lead III, and flattening elsewhere, no acute ischemic changes, no significant change compared EKG done in 2018    Emergency Department Procedures         ED Course and MDM     Tasha Buckley is a 27 y.o. female who presented to the emergency department with Overdose-Intentional (Pt reports she intentionally ingested 6-800mg  of ibuprofen. Pt states she doesn't know if she did it to harm herself, she thinks she is just sad)      This is a 27 year old female, history of self-harm behavior, depression anxiety who presents after an intentional overdose of ibuprofen.  She took a total of less than 5 g of ibuprofen.  Has mild abdominal discomfort now, though examination is relatively normal.  Denies coingestions.    Vital signs here are reassuring without significant abnormalities.  Urine drug screen with just THC.  Acetaminophen and salicylate negative, ethanol negative.  hCG negative.  Metabolic panel and CBC unremarkable except for mild leukocytosis, nonspecific.  She has some small superficial lacerations to abdominal wall, that do not require repair, very superficial.    She is medically cleared.  I placed her on a statement of belief.  Will attempt placement at psychiatric facility, will monitor here until time of transport.    Medications - No data to display    Tasha Buckley  Critical Care Time (Attendings)          Tasha Buckley  08/14/22 0530

## 2022-08-14 NOTE — Nursing Note (Signed)
Patient is currently on a hold. Sitter at bedside. Please see close observation record. Patient resting comfortably in bed at this time. Will continue to monitor patient closely.

## 2022-08-14 NOTE — Nursing Note (Signed)
Sitter at bedside. Please see close observation record.

## 2022-08-14 NOTE — Nursing Note (Signed)
RN called report to Verlon Au, Charity fundraiser at [facility].

## 2022-08-14 NOTE — Nursing Note (Signed)
Patient to be transferred to [facility] at 1245 via [facility]. Patient updated with plan of care.

## 2022-08-14 NOTE — ED Notes (Addendum)
Patient found to fresh superficial lacerations to her abdomen.

## 2022-08-14 NOTE — Nursing Note (Signed)
Social worker, Secretary/administrator at bedside.

## 2022-08-14 NOTE — Progress Notes (Signed)
Dr Fayrene Fearing has accepted the patient for admission and transfer to the [facility]. Attending, nursing, and patient all made aware. [facility] to arrange transportation. Nurse to call report with ETA to [phone]    Rosanna Randy MSW, LSW  [phone]

## 2022-08-14 NOTE — Nursing Note (Signed)
Medications verified by: patient and by attempted to call Pharmacy but they are closed .    Patient reviewed with Dr. Fayrene Fearing, who accepted for inpatient admission. PTA medications reviewed and orders received to restart all PTA medications. Orders received for [facility] Inpatient Adult Order Set, with the emergency IM medication zyprexa.    Security search completed by Theatre stage manager and skin assessment completed by [employer].

## 2022-08-14 NOTE — Nursing Note (Signed)
RN notified [facility] regarding transportation set-up to [facility] [place]. Awaiting transport time at this time.

## 2022-08-14 NOTE — Nursing Note (Signed)
Patient is currently on a hold. Sitter at bedside. Please see close observation record. Patient resting comfortably in bed at this time. Will continue to monitor patient closely. VSS at this time. Patient denies chest pain or shortness of breath.

## 2022-08-14 NOTE — Nursing Note (Signed)
Tasha Buckley arrived on the unit at 1445 time accompanied by staff.  Tasha Buckley is on a hold being admitted for an intentional OD.  She was oriented to the unit and their room and demonstrated use of call light.  Unit Expectations and Guidelines and Programming schedule were reviewed with patient and given a copy of each.  Signature obtained on Medication Consent.  Tasha Buckley brought no home medications with her to [facility]. She rates anxiety 10/10 and depression 8/10.  She denies SI, HI and AVH currently now on the unit.  She agrees to notify staff immediately if this changes and she feels at risk for harming herself.       Tasha Buckley had no scheduled medications at this time.  PRNs: None.    Tasha Buckley is able to verbalize that she will remain safe at this time.  [name] Risk Level = High    [name]., Registered Nurse / Designee

## 2022-08-14 NOTE — ED Provider Notes (Signed)
[facility]  Reassessment Note    Tasha Buckley is a 27 y.o. female who presented to the emergency department on 08/14/2022. This patient was initially seen by an off-going provider and their care has been turned over to me. Please see the original provider's note for details regarding the initial history, physical exam and ED course.  At the time of turnover the following steps in the patient's evaluation were pending:     Psych placment. Pt accepted for admission at [facility]. Pt remained stable for duration of my care.        Clinical Impression:    1.  SI

## 2022-08-14 NOTE — Group Note (Signed)
Group Note    Patient Name: Tasha Buckley    Date: 08/14/2022      Time: 1300                Group Type: Enrichment     Group Name: Sanctuary: Finding Delorise Shiner in the [place]     Group Objective: Group participants will be able to identify how and where to see a place of refuge, create space/time for sanctuary from life's storms and to welcome grace for personal renewal and spiritual well-being.     Attendance: Did not attend    Interactions: Did not interact    Mood/Affect: Unable to assess    Patient Participation: PT was not seen in milieu during group. Handout was left and PT made aware of ongoing availability of Spiritual Care.         Rabbi Kendal Hymen [place], [place]

## 2022-08-14 NOTE — ED Notes (Signed)
[facility] looking to see if they can accept patient.

## 2022-08-14 NOTE — Progress Notes (Signed)
[facility]   Inpatient Admission Assessment    Patient is a 27 year old White female who arrived for inpatient admission from [facility] at 1315. Patient is a Involuntary admission. Patient states around 1am she had a suicide attempt by taking medications. She states she instantly regretted it and told her husband and he brought her to the ED. Patient reports she hasn't taken her medications since Friday and has been having issues with her husband. Patient denies SI at this time. denies HI. denies AVH. Patient's [place] Suicide Risk Assessment was high . Patient's violence risk assessment was low . Patient endorses alcohol use, occasionally. Patient endorses Marijuana. Patient denies tobacco use. Patient denies previous outpatient providers. Patient reported issues with decreased appetite and no issues with sleep. Report was given to Carollee Herter, RN and Luther Parody, RN at [phone]. Patient was taken to [facility] [place] at 1430.    Name: Tasha Buckley  MRN: [id number]  Admission date: (Not on file)      Healthcare Directives:     Advance Directive: Patient does not have advance directive  No Advanced Directive: Patient would NOT like information about advanced directives, forgoing or with-drawing life-sustaining treatment, and withholding resuscitative services  Healthcare Agent Appointed: No  Pre-existing DNR/DNI Order: No  Patient Requests Assistance: No      Notfiy per patient:            Admission Handbook:     Patient handbook provided and reviewed?: Yes      Vitals/ Pain:     Temp: 97.2 F (36.2 C)  Heart Rate: 68  BP: 111/56  MAP (mmHg): 68  BP Location: Right upper arm  BP Method: Automatic  Patient Position: Sitting  SpO2: 97 %  O2 Device: None (Room air)  Weight Source: Standing Scale  Patient's Stated Pain Goal: No pain  Pain Score: 0 - No Pain      [name] Suicide Severity Rating Scale - Screen Version Recent:     Have you wished you were dead or wished you could go to sleep and not wake up?:  Yes  Have you actually had any thoughts of killing yourself?: Yes  Have you been thinking about how you might do this?: Yes  Have you had these thoughts and had some intention of acting on them?: Yes  Have you started to work out or worked out the details of how to kill yourself? Do you intend to carry out this plan?: Yes  Have you ever done anything, started to do anything, or prepared to do anything to end your life?: No    Assigned Risk: High Risk  High Risk Interventions: 15 minute checks at irregular intervals;Encourage attending DBT/CBT groups;Pharmacological Treatment;Encourage to attend groups to learn coping skills, stress management, symptome management;Family/Significant other engagement;Body/Belongings search        Mental Status Exam:     Apparent Age: Appears Actual Age  Hygiene/Grooming: Disheveled, Tattoos, Piercings  General Attitude: Cooperative  Motor Activity: Unremarkable  Eye Contact: Appropriate  Facial Expression: Anxious  Patient Behaviors/Mood: Cooperative, Anxious  Impulsivity: Normal  Speech Pattern: Within Defined Limits  Mood: Anxious  Affect: Appropriate to Circumstances  Affect congruent with mood: Yes  Content: Blaming self  Delusions: Within Defined Limits  Perception: Appropriate  Hallucination: None  Thought content appropriate to situation: Yes  Danger to Others (WDL): Within Defined Limits  Thought process: Appropriate  Memory Impairment: None  Cognition: Appropriate for developmental age, Follows simple commands, Follows 2-step commands,  Poor attention/concentration  Orientation Level: Oriented X4  Intelligence: Average  Insight: Average  Judgement: Average  Appetite Change: Decreased  Do you have any sleep concerns?: Denies problem  Libido: Normal        V-Risk-10:     V Risk 10 Screening  Previous and/ or current violence: No  Previous and/ or current threats (verbal/ physical): No  Previous and/ or current substance abuse: Yes  Previous and/ or current major mental illness:  Yes  Personality Disorder: No  Shows lack of insight into illness and/ or behaviour: No  Expresses suspicion: No  Shows lack of empathy: No  Unrealistic planning: No  Future stress-situations: No    V Risk 10 Disposition  Overall Clinical Evaluation: Low  Suggestion following overall clinical evaluation: No more detailed violence risk assessment    Nutrition Screen:     Unplanned Weight Loss of 10 lbs or more in Last Three Months: No  Unplanned Weight gain of 10 lbs or more in Last Three Months: No  Decrease in food intake and/or appetite: Yes (Comment)  Food Allergies: No  Eating habits or behaviors that maybe indicators for an eating disorder: No  Dental Problems or Swallowing Issues: No  Enteral Nutrition: No  Pressure Ulcer or Non-Healing Wound: No      Patient Access Code:     Patient Access Code: [id number]  Patient Mental Health Legal Status: Involuntary      Patient Checks:     Visual Checks: Standard Q15  Arm Bands On: ID  Patient Checked for Contraband: Belongings checked, Body checked, Clothing checked      Safety:     Recent Psychological Experiences: Conflict (Comment) (issues with husband)  Depression rating: 8  Anxiety rating: 10  Protective factors: Sense of optimism/sef efficacy  Self Injurious Thoughts: Intent and plan for self injurious actions (Comment)  Self Harm/Suicidal Ideation Plan: OD on medications  Self Injurious Behaviors: None observed  Family Suicide History: No  Thoughts of Harming Others: Denies  Do you have access to weapons in the home?: No  History of Aggression or Violence: No history  Current Thoughts of Aggression: No  Does Patient's Family Have a History of Aggression or Violence?: No  Pt/Family Hx of Legal problems d/t aggression: No  Elopement risk?: No  Restraint Contraindications: None  Restrained/secluded in past year?: No  If restrained/secluded, notify someone?: No  Methods to Calm Down: 1:1 Time, Quiet time in room      Disordered Eating:     Does Patient Have Unusual Eating  Patterns?: Yes (decreased)  Does Patient Eat Non Food Items?: No  Does Patient Have an Excessive Fluid Intake?: No  Does Patient Have an Excessive Calorie consumption or restriction?: No        Physical Assessment:     Neurological: Normal  Sensory Impairment: Vision, Glasses  Cardiovascular: Normal  Respiratory: Normal  Musculoskeletal: Normal  Gastrointestinal: Normal  Genitourinary: Normal  Sexuality: Normal  Endocrine: Normal        Sleep Assessment:     Sleep Pattern: Sleeps all night  Average Number of Sleep Hours: 8 Hours  Restful Sleep: Yes  Difficulty Falling Asleep: No  Difficulty Staying Asleep: No  Difficulty Arising: No  Use of Sleep Aids: none reported        Anselmo Reihl Larrie Kass, MSW  08/14/2022  1:59 PM

## 2022-08-14 NOTE — Nursing Note (Signed)
[facility] at bedside to take patient to [facility] at this time. RN provided report to ambulance company. IV removed from RAC and tip intact, 2 x 2 gauze applied and secured with Tegaderm. Patient tolerated well. Patient's belongings with transport team at this time.

## 2022-08-15 DIAGNOSIS — F603 Borderline personality disorder: Principal | ICD-10-CM

## 2022-08-15 LAB — LCOH COMPLETE BLOOD COUNT W/ DIFF
Basophils Absolute: 0.1 10*3/uL (ref 0.0–0.3)
Basophils Relative: 0.6 % (ref 0.0–2.0)
Eosinophils Absolute: 0.2 10*3/uL (ref 0.0–0.5)
Eosinophils Relative: 2.5 % (ref 0.0–5.0)
Hematocrit: 42.8 % (ref 34.0–49.0)
Hemoglobin: 14.2 G/DL (ref 11.2–15.7)
Immature Granulocytes (%): 0.3 % (ref <1)
Immature Granulocytes Absolute: 0 10*3/uL (ref 0.0–0.1)
Lymphocytes Absolute: 3.1 10*3/uL (ref 0.9–4.1)
Lymphocytes Relative: 32.5 % (ref 14.0–51.0)
MCH: 29.6 PG (ref 26.0–34.0)
MCHC: 33.2 G/DL (ref 30.7–35.5)
MCV: 89.2 FL (ref 80.0–100.0)
MPV: 9.5 FL (ref 7.2–11.7)
Monocytes Absolute: 0.6 10*3/uL (ref 0.2–1.0)
Monocytes Relative: 6.4 % (ref 4.0–12.0)
Neutrophils Absolute: 5.6 10*3/uL (ref 1.8–7.5)
Neutrophils Relative: 57.7 % (ref 42.0–80.0)
Platelets: 324 10*3/uL (ref 140–400)
RBC: 4.8 M/uL (ref 3.95–5.26)
RDW: 12.5 % (ref <15.1)
WBC: 9.6 10*3/uL (ref 3.5–10.9)
nRBC: 0 /100 WBC

## 2022-08-15 LAB — LCOH URINE BETA-HCG: Beta Hcg: NEGATIVE

## 2022-08-15 LAB — URINALYSIS W/RFL TO MICROSCOPIC
Bilirubin, UA: NEGATIVE MG/DL
Blood, UA: NEGATIVE MG/DL
Glucose, UA: NEGATIVE MG/DL
Ketones, UA: NEGATIVE MG/DL
Nitrite, UA: NEGATIVE
Protein, UA: NEGATIVE MG/DL
Specific Gravity, UA: 1.02 (ref 1.005–1.030)
Urobilinogen, UA: <2 MG/DL (ref 0–2)
pH, UA: 6 (ref 4.5–8.0)

## 2022-08-15 LAB — LIPID PANEL
Cholesterol, Total: 176 MG/DL (ref <200)
HDL: 32 MG/DL — ABNORMAL LOW (ref >59)
LDL Cholesterol: 122 MG/DL — ABNORMAL HIGH (ref <100)
Triglycerides: 109 MG/DL (ref <150)
VLDL Cholesterol Cal: 22 MG/DL (ref 4–28)

## 2022-08-15 LAB — HEMOGLOBIN A1C: Hemoglobin A1C: 5.5 % (ref 4.0–6.0)

## 2022-08-15 LAB — COMPREHENSIVE METABOLIC PANEL
A/G Ratio: 1.7 RATIO (ref 0.8–2.6)
ALT: 15 U/L (ref 0–60)
AST: 12 U/L (ref 0–55)
Albumin: 4.1 G/DL (ref 3.5–5.2)
Alkaline Phosphatase: 70 U/L (ref 23–144)
BUN/Creatinine Ratio: 11 (ref 7–25)
BUN: 10 MG/DL (ref 3–29)
CO2: 24 MEQ/L (ref 19–32)
Calcium: 9.3 MG/DL (ref 8.5–10.5)
Chloride: 110 MEQ/L (ref 96–110)
Creatinine: 0.9 MG/DL (ref 0.5–1.2)
Globulin, Total: 2.4 G/DL (ref 1.9–3.6)
Glomerular Filtration Rate (GFR): 91 mL/min/{1.73_m2} (ref >60)
Glucose: 93 MG/DL (ref 70–99)
Potassium: 4.2 MEQ/L (ref 3.4–5.3)
Sodium: 144 MEQ/L (ref 135–148)
Total Bilirubin: 0.4 MG/DL (ref 0.0–1.2)
Total Protein: 6.5 G/DL (ref 6.0–8.3)

## 2022-08-15 LAB — LCOH-QUALITATIVE DRUG SCR
Amphetamine, UR: NOT DETECTED
Barbiturate, UR: NOT DETECTED
Benzodiazepine UR: NOT DETECTED
Buprenorphine, Urine: NOT DETECTED
Cocaine (Metabolite), UR: NOT DETECTED
Creatinine, Random Urine: 147 MG/DL (ref >25)
Fentanyl, Ur: NOT DETECTED
Marijuana, Qual: POSITIVE — AB
Methadone Metabolite (EDDP): NOT DETECTED
Methadone, UR: NOT DETECTED
Opiates UR: NOT DETECTED
Oxycodone UR: NOT DETECTED
Specific Gravity, Urine: 1.029 (ref 1.003–1.030)
Tricyclic Antidepressant, UR: NOT DETECTED
pH, Urine: 5.5 (ref 5.0–9.0)

## 2022-08-15 LAB — URINALYSIS, MICROSCOPIC: Bacteria, UA: NONE SEEN /HPF

## 2022-08-15 LAB — VITAMIN D 25 HYDROXY: Vit D, 25-Hydroxy: 28 NG/ML — ABNORMAL LOW (ref 30–100)

## 2022-08-15 LAB — TSH: TSH: 1.95 MCIU/ML (ref 0.400–4.500)

## 2022-08-15 LAB — VITAMIN B12: Vitamin B-12: 315 PG/ML (ref 200–1100)

## 2022-08-15 LAB — FOLATE: Folic Acid: 6.7 NG/ML (ref >3.3)

## 2022-08-15 MED ORDER — FLUoxetine (PROZAC) capsule 60 mg
20 | Freq: Every evening | ORAL | Status: AC
Start: 2022-08-15 — End: 2022-08-17
  Administered 2022-08-17: 02:00:00 via ORAL

## 2022-08-15 MED FILL — HYDROXYZINE PAMOATE 25 MG CAPSULE: 25 25 MG | ORAL | Qty: 1

## 2022-08-15 MED FILL — ARIPIPRAZOLE 10 MG TABLET: 10 10 MG | ORAL | Qty: 1

## 2022-08-15 MED FILL — FLUOXETINE 20 MG CAPSULE: 20 20 MG | ORAL | Qty: 2

## 2022-08-15 NOTE — Group Note (Signed)
Group Note    Patient Name: Tasha Buckley    Date: 08/15/2022      Time:  1700     Group Type: CBT    Group Name: Core Beliefs    Group Objective: To teach patients what core beliefs are and how they affect how we see the world. To discuss harmful core beliefs and how they affect patients. To help patients determine what core beliefs they have and analyze them with evidence.      Attendance: Attended    Interactions: Interacted appropriately    Mood/Affect: Appropriate and Blunted/flat    Patient Participation: Tasha Buckley attended the group and provided input consistent with the objective. Tasha Kitchen    [name] C Beda Dula, MHS

## 2022-08-15 NOTE — Group Note (Signed)
Name: Tasha Buckley    Date: 08/15/2022    Time: 2:00    Group Type: Social Work    Group Name: Positive Experiences    Group Objective:  The Positive Experiences worksheet asks your client to write about a time that they showed courage, kindness, selflessness, love, sacrifice, wisdom, happiness, and determination. Remembering these experiences will help to remind your client that they have the potential to be good. Let your client take the completed worksheet home to keep as a positive reminder.    Attendance: Did not attend    Interaction: Did not interact    Mood/Affect: Unable to assess    Participation: Pt was not present for group due to refusal, social worker provided handout to review.      [name] [facility], MSW, LSW

## 2022-08-15 NOTE — Group Note (Signed)
Group Note    Patient Name: Tasha Buckley    Date: 08/15/2022      Time: 1100     Group Type: DBT    Group Name: Check the Facts    Group Objective: to learn check the facts as a skill to help understand whether a challenging emotion or situation has been caused by a prompting event or one's subjective interpretation of an event or both. Checking the facts can lead to reduction in levels of distress and increase ability to problem solve effectively. Pt engaged in bingo filled with various emotions. For each emotion called patients would check the facts and share times they experienced this emotion.      Attendance: Attended    Interactions: Interacted appropriately    Mood/Affect: Appropriate    Patient Participation: Patient attended group and completed worksheet and shared with peers. Patient engaged in conversation about group topics.      Synetta Fail

## 2022-08-15 NOTE — Group Note (Signed)
Name: Tasha Buckley    Date: 08/15/2022    Time: 11:44 AM    Group Type: Recreation Therapy    Group Name: Creative Expression    Group Objective: Group members engaged in a creative expression group. They painted wooden boxes. The goal of the group was to learn to use creative expression as a healthy outlet as well as a healthy leisure resource.     Attendance: Attended    Interaction: Interacted appropriately     Mood/Affect: Appropriate    Participation: Pt engaged well in the group. She was pleasant and social. She was well focused on her work. She was engaged well in group conversation.       Odie Edmonds, CTRS
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
## 2022-08-15 NOTE — Psychotherapy (Signed)
[facility]  Initial Psychiatric Evaluation    Name: Tasha Buckley  MRN: [id number]  Date & Time of Admission: 08/14/2022  2:26 PM  Duration: 72 minutes      Chief Complaint: I haven't taken my medicine since Friday and have been having problems with my husband    HPI:  Patient is a 27 y.o. female who presents following an impulsive overdose attempt.  Patient was admitted  involuntarily.    [name] reports a history of MDD, GAD and BPD. She shares that her biggest stressor which destabilized her mood occurred on Friday. She and her husband decide to try something new sexually, which she ended up regretting immediately. She did not tell him, and he is upset with her that she did not tell him to stop and is now discussing divorce. Prior to this, does feel she was already struggling with depression and was self-medicating with THC gummies daily. She regretted her OD attempt immediately and went to the ER. She denies SI today and appears future oriented, mentioning excitement to start a new job next week. Reports stable sleep and appetite. Denies any history of mania or psychosis, does report periods of impulsivity lasting a few hours at a time.      Past Psychiatric History:  Previous inpatient admission:  Estimates that this is her 5th or 6th hospitalization as an adult. All hospitalizations have been for depression/SI  Previous history of violence to self or others: yes - 2x OD attempt, this week and several years ago . Also has a history of cutting  Past/Current Outpatient Treatment: none- was being seen [facility], but dismissed due to missing too many appointments. PCP has been providing med refills  Past Neuromodulation: none  Past Psychiatric Medication Trials: Seroquel (felt like a zombie), Celexa (worked but Producer, television/film/video out), Wellbutrin (side effects). Abilify and Prozac have been prescribed for a couple of years and have been generally very helpful    Substance Use History: See HPI and Social  Work History.  Per intake and reviewed with pt:   reports that she has never smoked. She has never used smokeless tobacco. She reports current alcohol use of about 10.0 standard drinks of alcohol per week. She reports current drug use. Frequency: 4.00 times per week. Drug: Marijuana.        Past Medical History:   Diagnosis Date    Anxiety     Concussion     mild, age [age], fell on treadmill at gym    Depression     Mood swings         No past surgical history on file.     Family History   Problem Relation Age of Onset    Alcohol abuse Mother     Bipolar disorder Mother     Depression Mother     Anxiety disorder Sister     OCD Sister     Learning disabilities Sister           Social History    Also See Social Work History  She has been in a relationship with her husband for 2 years, married for 3 mos. They live together in an apartment in [place]. She is close with her parents. 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     Medications Prior to Admission   Medication Sig Dispense Refill Last Dose    ARIPiprazole (ABILIFY) 10 MG  tablet Take 1 tablet (10 mg total) by mouth daily.   08/10/2022    FLUoxetine (PROZAC) 40 MG capsule Take 1 capsule (40 mg total) by mouth daily.   08/10/2022     No Known Allergies     Physical Review Of Systems:  Review of Systems   Constitutional:  Negative for weight loss.   HENT:  Negative for congestion.    Respiratory:  Negative for cough.    Cardiovascular:  Negative for chest pain.   Gastrointestinal:  Negative for constipation, diarrhea, nausea and vomiting.   Musculoskeletal:  Negative for falls.   Skin:  Negative for rash.   Neurological:  Negative for dizziness and headaches.   Endo/Heme/Allergies: Negative.    Psychiatric/Behavioral:  Negative for suicidal ideas.          Psychiatric Review Of Systems:  Sleep: fair  Interest: varying  Anhedonia: varying  Appetite Changes: unchanged  Weight Changes: No  change  Energy: unchanged  Libido: decreased  Anxiety/Panic:  increased  Guilt: increased   Hopeless: increased  S.I.B.s/risky behavior:  increased    Objective:  Vital signs in last 24 hours:  Temp:  [97.2 F (36.2 C)-98.1 F (36.7 C)] 98.1 F (36.7 C)  Heart Rate:  [68-74] 70  Resp:  [16] 16  BP: (111-142)/(56-109) 142/73    Mental Status Exam:  General   Development: Normal  Grooming/ Hygiene: Unkempt  Demeanor: Polite, Cooperative  Eye Contact: Appropriate  Speech  Rate: Normal  Volume: Normal  Articulation: Normal  Quality: Normal  Motor  Atrophy: None  Abnormal movements: None  Station: Normal  Gait: Normal  Mood/ Affect  Mood: Neutral  Range: Normal  Reactivity: Normal  Appropriateness: Appropriate to mood and/ or situation  Thought   Content: Normal  Process: Normal  Associations: Normal  Physical and Psychological Reality Test: Normal, Hallucinations - denied  Cognitive  Level of Alertness: Normal  Orientation: Oriented to all spheres  Short term memory: Intact  As evidenced by: Recall of medication taken this morning  Long term memory: Intact  As evidenced by: other (verifiable treatment history)  Attention/ Concentration/ Focus: Intact  Language: Intact  Intellect: Average  As evidenced by: Vocabulary, Education  Fund of knowledge: Intact  Safety  Harm to self: No  Risk of Harm to self: Moderate  Harm to others: No  Risk of Harm to others: Low  Insight/ Judgement  Insight: Full  Judgment: Intact    Labs:  Available admission labs reviewed      Assets/Strengths/Protective Factors:  ability to engage, maturity, optimistic, prior successful treatment, and supportive network    Weaknesses/Limitations/Barriers to Treatment:  chaotic environment, lacks impulse control, and mental illness    Attitudes and Behaviors that require change:  Affective instability/mood dysregulation  Suicidal ideation  Feelings of hopelessness and helplessness  Poor self esteem/self critical internal dialogue  Poor or limited  compliance with mental health treatment plan  Active substance use  Maladaptive coping strategies    CGI - Admission Severity Score:  CGI Baseline: Markedly ill        Diagnoses:  Borderline Personality Disorder  MDD, recurrent, moderate  GAD    Problem Based Assessment and Management Plan:   Kimyatta Shinaberry is a 27 y.o. female with a history of MDD, BPD and GAD. She presents s/p suicide attempt which occurred in the context of marital stressors. Suicide attempt appears most related to BPD, though she does reports some persistent moderate depression prior to attempt. Admitted involuntarily, but  willing to sign in as a voluntary patient.     BPD  -Currently not in therapy, SW will aid in arranging OP follow-up. Recommend DBT focus  -Suspect immediate suicidality has resolved, but given ongoing conflict with husband and pt's impulsivity, will continue hospitalization to manage risk. Suspect LOS 2-3 days    MDD/GAD  -Increase Prozac to 60 mg (will start increased dose tomorrow, as she had been off of medications for several days)  -Continue Abilify 10 mg for depression augmentation    Medical  -H&P to follow, consult IM as needed  -Review labs when available  -Review EKG when available    Safety  -Monitor for SI, SIB  -Continue staggered observational checks  -Privilege level: off unit with supervision. Pt has demonstrated the ability to be safe in the hospital. No dangerous or inappropriate behaviors have been observed since admission. Pt has been calm and compliant with care. Pt has agreed to stay safe in the hospital and to notify staff if feeling unsafe.    Discharge pending patient safety, symptom improvement, and medication tolerability. Risks, benefits, side effects, and alternatives to the above plan were discussed with patient. The patient voiced understanding and agreement with the treatment plan.    Ethelda Deangelo MARIE Creighton Longley, CNP  08/15/2022

## 2022-08-15 NOTE — H&P (Addendum)
[place]  [facility]  Medical History & Physical    CPT Code: 04540    Tasha Buckley  [id number]    Date of Evaluation: 08/15/2022  Reason for Admission: SI    Patient with PMHx of MDD presents to [facility] after suicide attempt and intentional overdose. Patient took 6 ibuprofen pills and immediately regretted it and went to ED. Patient states she has had stressors including trouble with her marriage which lead to this. She currently denies SI, HI, hallucinations. Denies any fevers, chills, N/V, urinary symptoms Denies any recent illness, sick contacts or hx of travel. Vitals stable      History     No Known Allergies    Past Medical History:   Diagnosis Date    Anxiety     Concussion     mild, age [age], fell on treadmill at gym    Depression     Mood swings      No past surgical history on file.  Family History   Problem Relation Age of Onset    Alcohol abuse Mother     Bipolar disorder Mother     Depression Mother     Anxiety disorder Sister     OCD Sister     Learning disabilities Sister          Substance Use History:   Social History     Substance and Sexual Activity   Alcohol Use Yes    Alcohol/week: 10.0 standard drinks of alcohol    Types: 10 Standard drinks or equivalent per week    Comment: Only drinks a few times a month but due to high tolerance needs to drink 10+ drinks to feel anything     Social History     Tobacco Use   Smoking Status Never   Smokeless Tobacco Never     Social History     Substance and Sexual Activity   Drug Use Yes    Frequency: 4.0 times per week    Types: Marijuana         Review of Systems     Review of Systems   Constitutional:  Negative for chills, fever, malaise/fatigue and weight loss.   HENT:  Negative for congestion and sore throat.    Eyes:  Negative for blurred vision and redness.   Respiratory:  Negative for cough and shortness of breath.    Cardiovascular:  Negative for chest pain, palpitations and leg swelling.   Gastrointestinal:  Negative for  abdominal pain, constipation, diarrhea, heartburn, nausea and vomiting.   Genitourinary:  Negative for dysuria, frequency, hematuria and urgency.   Musculoskeletal:  Negative for joint pain and myalgias.   Skin:  Negative for rash.   Neurological:  Negative for dizziness, focal weakness and headaches.   Endo/Heme/Allergies:  Does not bruise/bleed easily.   Psychiatric/Behavioral:  Positive for depression. The patient is nervous/anxious.             Physical Exam     Vitals:    08/14/22 1350 08/14/22 1423   BP: 111/56    BP Location: Right upper arm    Patient Position: Sitting    Pulse: 68    Temp: 97.2 F (36.2 C)    SpO2: 97%    Weight:  (!) 259 lb (117.5 kg)   Height:  5' 5 (1.651 m)       General Appearance: Well nourished, in no acute distress  Pulm: lungs clear to auscultation bilaterally  CV: S1/S2  and No murmur  GI: non-tender, non-distended  Extr: no clubbing, cyanosis or edema  Mental Status: alert and oriented to person, place, time, situation  CN II: pupils equal, round, reactive to light with accommodation  CN III/IV/VI: extraocular movements intact   CN V: facial sensation normal in VI, VII, VII,  Assessed by light touch.  CN VII: facial motion intact/symmetric. Assessed by smile/frown, puffing cheeks, raise eyebrows, closing eyes tightly  CN VIII: hearing intact to finger rub  CN IX/X: no dysarthria or dysphagia and voice not hoarse  CN XI: sternocleidomastoid 5/5 symmetric strength, trapezius 5/5 symmetric strength  CN XII: tongue protrudes midline  Motor: Bulk: normal  Tone: normal in all four extremities  Strength: 5 in all four extremities  Sensory: light touch intact bilaterally upper and lower extremities  Coord: no dysmetria or tremor noted, finger nose finger normal bilaterally, finger tapping normal bilaterally  Gait: normal gait  Frontal Release Reflexes: glabellar tap normal  Clonus: normal  Dysarthia: no dysarthria      Labs     Available admission labs reviewed. Mildly elevated LDL, low  vit D      Assessment and Plan     #SI  #MDD  Plan per primary      #Vitamin D deficiency  -Recommend starting 1000IU cholecalciferol daily      #Elevated LDL  Total chol normal. Recommend diet and lifestyle modifications and follow up with pcp for further management,    No acute medical concerns, labs as mentioned above. EKG reviewed and Normal sinus. Feel free to consult medicine team if any acute concerns.      Daxon Kyne, MD  08/15/2022 5:31 AM

## 2022-08-15 NOTE — Group Note (Signed)
Group Note    Patient Name: Tasha Buckley    Date: 08/15/2022      Time: 0900     Group Type: Goal Setting    Group Name: Community    Group Objective: This clinician assessed patients' physical and mental wellness via group discussion and review of daily check in document. Clinician also assessed patients' for presence of suicidal and/or homicidal ideation, plans or intentions and addressed patient concerns. Unit schedule, rules and regulation were reviewed. In addition, group discussed the importance of hygiene as needed.      Attendance: Attended    Interactions: Interacted appropriately    Mood/Affect: Appropriate    Patient Participation: Patient attended group and filled out worksheet.    Amada Kingfisher MHS

## 2022-08-15 NOTE — Plan of Care (Signed)
Problem: At Risk for Suicide  Description: Pt was admitted to [facility] for SI  Intervention: Intervention  Description: SW will input therapy and medication management appointments for patient in their AVS and review with them    Responsible staff: Emma Hehman MSW, LSW/designee  Note: Pt signed in as a voluntary admission

## 2022-08-15 NOTE — Progress Notes (Signed)
THERAPEUTIC RECREATION ASSESSMENT    Name: Tasha Buckley  DOB: 12-30-1995  Attending Physician: Jennette Banker Pettibone,*  Admission Diagnosis: Depression  Date: 08/15/2022       Stated Goals  Patient Stated Goal: Figure things out with my husband      Prior Function  Prior Function  Functional Mobility: Independent ( no assistive device)  Lives With: Spouse  Receives Help From: Family, Friend(s)  ADL Assistance: Independent  Homemaking/IADL Assistance: Independent  Vocation: Full time employment  Comments: preschool Control and instrumentation engineer  Current Vision: No new visual deficits    Hearing  Hearing: No Deficits    Cognition  Overall Cognitive Status: Within Functional Limits  Orientation Level: Oriented X4    Social Domain  Overall Emotional Status: Impaired  Emotional Barriers: Anxious    Community Domain  Overall Community Domain Status: Within Functional Limits    Leisure Domain  Overall Leisure Domain Status: Within Functional Limits  Leisure Interest: talking/conversing, other (comment)  Comments: movies, going out places, [facility]    Pt admitted for SI. Pt reports she is having marital issues which caused her to want to OD. Pt works full time as a Manufacturing systems engineer. She has a fairly routine day. She does use THC gummies regularly. She feels she engages in leisure and uses it as a way to keep herself busy so she doesn't feel sad. She feels she has good friends that she enjoys spending time with. She has positive family supports. She feels organization is a barrier. She reports struggling with ADLs but feels this has been a life long issue. She would like to work on figuring things out with her husband and getting connected to a therapist.

## 2022-08-15 NOTE — Progress Notes (Signed)
Nutrition Therapy  Screen    Tasha Buckley  DOB: [birth year]  Admit Date: 08/14/2022  Admission Diagnosis Depression      Nutrition referral due to: decreased appetite    Past Medical History:   Diagnosis Date    Anxiety     Concussion     mild, age [age], fell on treadmill at gym    Depression     Mood swings        No Known Allergies    Food Allergies: NKFA    Current Diet:   Diet/Nutrition Orders    Diet Regular(7)     Frequency: Effective Now     Number of Occurrences: Until Specified       Ht Readings from Last 1 Encounters:   08/14/22 5' 5 (1.651 m)     Wt Readings from Last 1 Encounters:   08/14/22 (!) 259 lb (117.5 kg)     Body mass index is 43.1 kg/m.  Weight Changes: Patient stated she was 268 lbs in April of 2024 and she is currently 259 lbs. Stated this has been intentional weight loss.     Skin integrity: n/a    Lab Results   Component Value Date    GLUCOSE 103 (H) 08/14/2022    BUN 8 08/14/2022    CO2 25 08/14/2022    CREATININE 0.82 08/14/2022    K 3.8 08/14/2022    NA 140 08/14/2022    CL 105 08/14/2022    CALCIUM 9.4 08/14/2022     Lab Results   Component Value Date    CALCIUM 9.4 08/14/2022         Comments: RD met with patient to discuss nutritional concerns. Patient stated she had decreased appetite for ~24 hours due to life stressors. Stated that since admission, appetite has returned to normal. Reports eating 2 meals per day with snacks occasionally. Denied nausea/vomiting, chewing/swallowing difficulties, and diarrhea/constipation. Also stated she is drinking more water recently due to getting a new water bottle. Patient stated she usually skips breakfast due to sleeping in. RD and patient discussed drinking a Boost/Ensure or a smoothie so she has some nutrition in the morning to focus on consistent nutrition. Patient was agreeable to this. Patient had no further nutrition related questions or concerns at this time. RD will continue to monitor.    Nutritional Risk: low- follow up within  30 days      Recommendations/Plan  1. Encourage adequate nutrition consisting of 3 meals and 1-2 snacks daily from a variety of foods from all food groups.  2. Encourage adequate fluid intake consisting of a minimum of 2000 ml caffeine free beverages daily.  3. Monitor po intake and labs.      Lorraine Cimmino, RD, LD

## 2022-08-15 NOTE — Group Note (Signed)
Name: Tasha Buckley    Date: 08/15/2022    Time: 4:15 PM    Group Type: Recreation Therapy    Group Name: Collages    Group Objective: Group members created personal collages. They were encouraged to find words and pictures of things that represented what they enjoyed in life and what they were wanting in life. The goal of the group was to focus on finding the happiness and positives in life.     Attendance: Did not attend    Interaction: Did not interact    Mood/Affect: Unable to assess    Participation: Pt expressed disinterest in attending RT group and declined to attend. A handout on the benefits of engaging in healthy leisure activities was made available to Pt. Pt was made aware of an opportunity to ask questions related to group material.       Earnestene Angello, CTRS

## 2022-08-15 NOTE — Plan of Care (Signed)
Problem: At Risk for Suicide  Description: Pt was admitted to [facility] for SI  Intervention: Intervention  Description: SW will input therapy and medication management appointments for patient in their AVS and review with them    Responsible staff: Lyn Hollingshead MSW, LSW/designee  08/15/2022 1134 by Lyn Hollingshead, MSW, LSW  Note: SW spoke with pt's mom. Mom was given SW and unit phone numbers. Mom did not have any further questions at this time.   08/15/2022 1054 by Lyn Hollingshead, MSW, LSW  Note: Pt signed in as a voluntary admission

## 2022-08-15 NOTE — Group Note (Signed)
Group Note    Patient Name: Tasha Buckley    Date: 08/15/2022      Time:1:00pm                Group Type: Nutrition Group    Group Name: Nutrition Jeopardy    Group Objective: Patients played Nutrition Jeopardy. Various nutrition topics were covered: ChooseMyPlate, fruits and vegetables, rethink your drink, vitamins and minerals, whole grains, fun food facts and food trivia.     Attendance: Did not attend    Interactions: N/A    Mood/Affect: N/A    Patient Participation: Patient was sleeping in her room during group. Group materials made available to patient.  RD available to answer any questions regarding topic covered in group.     Kathlene November, RD, LD

## 2022-08-15 NOTE — Plan of Care (Incomplete)
At the first safety check of this night shift pt appeared to be sleeping quietly in their bed. Then pt appeared to be sleeping quietly for the remainder of the night. Q15 min safety checks were maintained throughout the shift. As of the 0600 check pt appeared to have slept a total of       hours on this night shift.

## 2022-08-15 NOTE — Plan of Care (Signed)
Problem: Risk for Suicidal Behavior   Description: Tasha Buckley is at Risk for Suicidal Behaviors AEB suicide attempt by overdose on home medications.    C-SSRS:  HIGH    Goal: Goal 1:STG/Objective  Description: Tasha Buckley will verbalize at least 2 copings skills to use as alternatives for dealing with stress and emotional problems by 08/17/2022.     Outcome: Progressing     Tasha Buckley presents as anxious with a responsive affect.  She rates anxiety 10/10 and depression 8/10.  Tasha Buckley denies SI, HI and AVH currently.  [employer] attended and engaged in 2 unit programs.  She ate 75% of dinner. Her husband was supposed to visit at 1800 and bring her clothes, when he did not show up Tasha Buckley was upset and tearful trying to call him. She stated she was afraid he was just standing her up because they have been fighting. Tasha Buckley ended up calling her parents and they brought her clothes instead. She stated the visit with her parents went well. She was medication compliant.  PRNs:  Vistaril 25 mg @ 1855 per patient request and for 10/10 anxiety.       Tasha Buckley is able to verbalize that they will remain safe at this time.  Based on the Treatment guidelines and the professional opinion of the RN and treatment team, she may have her own clothes, standard linens and all silverware with meals.  [name] Risk Level = Low.    Daily Medication Education Questions:  Do you have questions about your medications? No.  2.   Any questions about side effects and how to manage them? No.  3.   Any questions on how you take your medications? No.    Tasha Dell., Registered Nurse / [facility]   Inpatient Shift Assessment    Name: Tasha Buckley  MRN: [id number]  Admission date: 08/14/2022        Vitals:     Temp: 98.1 F (36.7 C)  Temp Source: Oral  Heart Rate: 70  Resp: 16  BP: 142/73  MAP (mmHg): 68  BP Location: Right upper arm  BP Method: Automatic  Patient Position:  Sitting  SpO2: 97 %  O2 Device: None (Room air)  Height: 5' 5 (165.1 cm)  Weight: (!) 259 lb (117.5 kg)  Weight Source: Standing Scale  BMI (Calculated): 43.2      Pain/ Pain Reassessment:     Pain Score: 0 - No Pain         Intake:            Output:            POCT Glucose:            [name] Suicide Severity Rating Scale - Frequent Screener:          Assigned Risk: Low Risk  Low Risk Interventions: 15 minute checks at irregular intervals;Reassess risk daily, upon status change and discharge;Pharmacological Treatment;Family/Significant other engagement;Encourage attending DBT/CBT groups;Encourage to attend groups to learn coping skills, stress management, symptome management        Mental Status Exam:     Apparent Age: Appears Actual Age  Hygiene/Grooming: Disheveled  General Attitude: Cooperative;Pleasant  Motor Activity: Freedom of movement  Eye Contact: Appropriate  Facial Expression: Anxious  Patient Behaviors/Mood: Cooperative;Tearful;Anxious  Impulsivity: Normal  Speech Pattern: Within Defined Limits  Mood: Anxious  Affect: Responsive  Affect congruent with mood: Yes  Content: Blaming self  Delusions: Within  Defined Limits  Perception: Appropriate  Hallucination: None  Thought content appropriate to situation: Yes  Danger to Others (WDL): Within Defined Limits  Thought process: Appropriate  Orientation Level: Oriented X4  Insight: Average  Judgement: Average           Ed[place] Fall Risk     Age: Less than 50  Mental Status: Fully Alert/ Oriented at all times  Elimination: Independent with control of bowel/ bladder  Medication: Psychotropic medications ( including benzos and antidepressants)  Psych Diagnosis: Major depressive disorder  Ambulation/ Balance: Independent/ Steady gait/ Immobile  Nutrition: No apparent abnormalities with appetite  Sleep Disturbance: No sleep disturbance  History of Falls: No history of falls  Secondary Diagnosis: No medical problems  [place] Fall Risk Score: 34              Patient Checks:     Interventions: Call bell within reach, Dayroom observation, ID band on  Visual Checks: Standard Q15  Arm Bands On: ID  Patient Checked for Contraband: Belongings checked, Body checked, Clothing checked        Safety:     Depression rating: 8  Anxiety rating: 10  Self Injurious Thoughts: Denies  Self Injurious Behaviors: None observed  Thoughts of Harming Others: Denies  Current Thoughts of Aggression: No  Elopement risk?: No  Methods to Calm Down: 1:1 Time;Change of environment;Quiet time in room;Reading        DASA     Irritablity: No  Verbal Threats: No  Impulsivity: No  Negative attitude: No  Unwillingness to follow direction: No  Sensitivity to perceived provocation: No  Easily angered when request denied: No  Total Score - DASA: 0      Withdrawl Symptoms:            Hygiene:     Level of Assistance: Independent      Nutrition Screen:     Feeding: Able to feed self  Diet Type: Oral diet as ordered  Appetite: Tasha Merino, RN  08/15/2022  11:55 PM

## 2022-08-15 NOTE — Plan of Care (Signed)
Problem: Risk for Suicidal Behavior   Description: Tasha Buckley is at Risk for Suicidal Behaviors AEB suicide attempt by overdose on home medications.    C-SSRS:  HIGH    Goal: Goal 1:STG/Objective  Description: Tasha Buckley will verbalize at least 2 copings skills to use as alternatives for dealing with stress and emotional problems by 08/17/2022.     Note:   Problem: Risk for Suicidal Behavior   Description: Tasha Buckley is at Risk for Suicidal Behaviors AEB suicide attempt by overdose on home medications.    C-SSRS:  HIGH    Goal: Goal 1:STG/Objective  Description: Tasha Buckley will verbalize at least 2 copings skills to use as alternatives for dealing with stress and emotional problems by 08/17/2022.     Note:  Pt was visible in the milieu and her room for the majority of the shift. Pt presents with a anxious mood with a responsive affect. Pt rates anxiety 8/10 and depression 8/10. Pt denies SI/HI/AVH and can come to staff with concerns. Pt attended 3  groups this shift. Pt contracts for safety. Pt ate 85% of breakfast, and 55% of lunch. Pt is pleasant and cooperative with staff and peers. Care plan ongoing.     [facility]   Inpatient Shift Assessment    Name: Tasha Buckley  MRN: [id number]  Admission date: 08/14/2022        Vitals:     Temp: 98.1 F (36.7 C)  Temp Source: Oral  Heart Rate: 70  Resp: 16  BP: 142/73  MAP (mmHg): 68  BP Location: Right upper arm  BP Method: Automatic  Patient Position: Sitting  SpO2: 97 %  O2 Device: None (Room air)  Height: 5' 5 (165.1 cm)  Weight: (!) 259 lb (117.5 kg)  Weight Source: Standing Scale  BMI (Calculated): 43.2      Pain/ Pain Reassessment:     Pain Score: 0 - No Pain         Intake:            Output:            POCT Glucose:            [name] Suicide Severity Rating Scale - Frequent Screener:     Have you actually had any thoughts of killing yourself since the last time you were asked?: No  Have you done anything, started to do  anything, or prepared to do anything to end your life?: No    Assigned Risk: Low Risk  Low Risk Interventions: 15 minute checks at irregular intervals;Reassess risk daily, upon status change and discharge;Encourage attending DBT/CBT groups;Encourage to attend groups to learn coping skills, stress management, symptome management        Mental Status Exam:     Apparent Age: Appears Actual Age  Hygiene/Grooming: Disheveled  General Attitude: Cooperative;Pleasant  Motor Activity: Unremarkable  Eye Contact: Appropriate  Facial Expression: Anxious  Patient Behaviors/Mood: Cooperative  Impulsivity: Normal  Speech Pattern: Within Defined Limits  Mood: Anxious  Affect: Responsive  Affect congruent with mood: Yes  Content: Blaming self  Delusions: Within Defined Limits  Perception: Appropriate  Hallucination: None  Thought content appropriate to situation: Yes  Danger to Others (WDL): Within Defined Limits  Thought process: Appropriate  Memory Impairment: None  Cognition: Follows 2-step commands  Orientation Level: Oriented X4  Insight: Average  Judgement: Average  Appetite Change: Increased        Edmonson Fall Risk     Age:  Less than 50  Mental Status: Fully Alert/ Oriented at all times  Elimination: Independent with control of bowel/ bladder  Medication: Psychotropic medications ( including benzos and antidepressants)  Psych Diagnosis: Major depressive disorder  Ambulation/ Balance: Independent/ Steady gait/ Immobile  Nutrition: No apparent abnormalities with appetite  Sleep Disturbance: No sleep disturbance  History of Falls: No history of falls  Secondary Diagnosis: No medical problems  Ed[place] Fall Risk Score: 61             Patient Checks:     Interventions: Call bell within reach, Dayroom observation, ID band on  Visual Checks: Standard Q15  Arm Bands On: ID  Patient Checked for Contraband: Belongings checked, Body checked, Clothing checked        Safety:     Depression rating: 8  Anxiety rating: 8  Self Injurious  Thoughts: Denies  Self Injurious Behaviors: None observed  Thoughts of Harming Others: Denies  Do you have access to weapons in the home?: Yes  What is your plan for securing the weapon(s)?: Pt reported that her husband has guns but that they are locked up and she does not know the code  Current Thoughts of Aggression: No  Elopement risk?: No  Restraint Contraindications: None        DASA     Irritablity: No  Verbal Threats: No  Impulsivity: No  Negative attitude: No  Unwillingness to follow direction: No  Sensitivity to perceived provocation: No  Easily angered when request denied: No  Total Score - DASA: 0      Withdrawl Symptoms:            Hygiene:     Level of Assistance: Independent      Nutrition Screen:     Feeding: Able to feed self  Diet Type: Oral diet as ordered  Appetite: Richmond Campbell  08/15/2022  1:41 PM

## 2022-08-16 MED FILL — HYDROXYZINE PAMOATE 25 MG CAPSULE: 25 25 MG | ORAL | Qty: 1

## 2022-08-16 NOTE — Group Note (Signed)
Group Note    Patient Name: Tasha Buckley    Date: 08/16/2022      Time: 1100     Group Type: DBT    Group Name: DBT- Opposite Action    Group Objective:to discuss opposite action as a skilled to be used when your emotions do not fit the facts or when acting on your emotions is not effective. Emotions are accompanied with an action urge and our goal is to change the emotion by acting opposite to its action urge.      Attendance: Did not attend    Interactions: Did not interact    Mood/Affect: Unable to assess    Patient Participation: Patient declined group due to sleeping at the time. Staff will attempt to offer group materials to complete independently. Staff will follow up at a later time.       Vanya Carberry S Nabiha Planck

## 2022-08-16 NOTE — Group Note (Signed)
Name: Kelce Rideaux    Date: 08/16/2022    Time: 4:10 PM    Group Type: Recreation Therapy    Group Name: Music Trivia      Group Objective: Purpose of this group was participation in games that would stimulate social interactions and cognitive recall.  Participants were asked as a group to identify answers to a variety of trivia questions involving songs.         Attendance: Attended     Interaction: Interacted appropriately     Mood/Affect: Appropriate     Participation: Pt engaged in group activity.  Pt was able to listen to music to guess songs.  Pt social with staff and peers.            Nayana Lenig E Lenzie Montesano, CTRS

## 2022-08-16 NOTE — Group Note (Signed)
Name: Tasha Buckley    Date: 08/16/2022    Time: 11:24 AM    Group Type: Recreation Therapy    Group Name: Creative Expressions - Sticker Mosaic      ?     Group Objective: Purpose of this group was to allow participants to express their creativity while making pictures with mosaic stickers. They were given the opportunity to listen to music and socialize while creating their pictures. Discussions during group centered on how participants felt while engaging in a leisure pursuit instead of sitting around and not engaging in a leisure pursuit.          Attendance: Did not attend    Interaction: Did not interact    Mood/Affect: Unable to assess    Participation: Pt expressed disinterest in attending RT group and declined to attend. A handout on the benefits of engaging in healthy leisure activities was made available to Pt. Pt was made aware of an opportunity to ask questions related to group material.       Julio Storr E Abiel Antrim, CTRS

## 2022-08-16 NOTE — Plan of Care (Signed)
Problem: Risk for Suicidal Behavior   Description: Tasha Buckley is at Risk for Suicidal Behaviors AEB suicide attempt by overdose on home medications.    C-SSRS:  HIGH    Goal: Goal 1:STG/Objective  Description: [name] will verbalize at least 2 copings skills to use as alternatives for dealing with stress and emotional problems by 08/17/2022.     Outcome: Progressing  Intervention: Intervention A  Description: Unit RN will assess Mariaisabella Meenan for suicidal risk by completing a [name] Suicide Risk Assessment (C-CSSR) and provide for safety based on the tallied score and following unit protocol for that score.    Responsible staff: Eulah Citizen., RN / Designee    Note: [name] presents as having an anxious/depressed mood with an responsive affect.  [name] rates anxiety 8/10 and depression 8/10.  [name] denies SI, HI and AVH currently.  [name] was observed withdrawn to herself and isolated to her room much of the shift.  [employer] attended and engaged in 1 unit programs.  [name] ate 100% of breakfast and 90% of lunch.  [name] was medication compliant.  PRNs:  None requested.     [name] is able to verbalize that they will remain safe at this time.  Based on the Treatment guidelines and the professional opinion of the RN and treatment team, they may have their own clothes, standard linens and all silverware with meals.  [name] Risk Level = Low.    [name] is afebrile and asymptomatic for COVID-19 and is compliant with all necessary precautions.     1. Have you developed any new flu symptoms (cough, sore throat, runny nose, body aches) since your admissions that you did not have prior to admitting? No       Zero orders have been placed and/or modified for [name] during this shift        [facility]   Inpatient Shift Assessment    Name: Tasha Buckley  MRN: [id number]  Admission date:  08/14/2022        Vitals:     Temp: 97.9 F (36.6 C)  Temp Source: Oral  Heart Rate: 62  Resp: 18  BP: 130/84  MAP (mmHg): 68  BP Location: Left upper arm  BP Method: Automatic  Patient Position: Sitting  SpO2: 97 %  O2 Device: None (Room air)  Height: 5' 5 (165.1 cm)  Weight: (!) 259 lb (117.5 kg)  Weight Source: Standing Scale  BMI (Calculated): 43.2      Pain/ Pain Reassessment:     Pain Score: 0 - No Pain         Intake:            Output:            POCT Glucose:            [name] Suicide Severity Rating Scale - Frequent Screener:     Have you actually had any thoughts of killing yourself since the last time you were asked?: No  Have you done anything, started to do anything, or prepared to do anything to end your life?: No    Assigned Risk: Low Risk  Low Risk Interventions: 15 minute checks at irregular intervals;Reassess risk daily, upon status change and discharge;Encourage to attend groups to learn coping skills, stress management, symptome management;Encourage attending DBT/CBT groups        Mental Status Exam:     Apparent Age: Appears Actual Age  Hygiene/Grooming:  Disheveled  General Attitude: Cooperative;Pleasant  Motor Activity: Freedom of movement  Eye Contact: Appropriate  Facial Expression: Anxious  Patient Behaviors/Mood: Calm;Cooperative;Anxious  Impulsivity: Normal  Speech Pattern: Within Defined Limits  Mood: Anxious;Depressed  Affect: Responsive  Affect congruent with mood: Yes  Content: Within Defined Limits  Delusions: Within Defined Limits  Perception: Appropriate  Hallucination: None  Thought content appropriate to situation: Yes  Danger to Others (WDL): Within Defined Limits  Thought process: Appropriate;Goals directed  Memory Impairment: None  Cognition: Follows 2-step commands;Poor judgement;Poor safety awareness;Poor attention/concentration  Orientation Level: Oriented X4  Insight: Impaired  Judgement: Impaired  Appetite Change: Normal for patient  Do you have any sleep concerns?:  Denies problem        Edmonson Fall Risk     Age: Less than 50  Mental Status: Fully Alert/ Oriented at all times  Elimination: Independent with control of bowel/ bladder  Medication: Psychotropic medications ( including benzos and antidepressants)  Psych Diagnosis: Major depressive disorder  Ambulation/ Balance: Independent/ Steady gait/ Immobile  Nutrition: No apparent abnormalities with appetite  Sleep Disturbance: No sleep disturbance  History of Falls: No history of falls  Secondary Diagnosis: No medical problems  [place] Fall Risk Score: 24             Patient Checks:     Interventions: Call bell within reach, Dayroom observation, ID band on  Visual Checks: Standard Q15  Arm Bands On: ID  Patient Checked for Contraband: Belongings checked, Body checked, Clothing checked        Safety:     Depression rating: 8  Anxiety rating: 8  Self Injurious Thoughts: Denies  Self Injurious Behaviors: None observed  Thoughts of Harming Others: Denies  Current Thoughts of Aggression: No  Elopement risk?: No  Restraint Contraindications: None        DASA     Irritablity: No  Verbal Threats: No  Impulsivity: No  Negative attitude: No  Unwillingness to follow direction: No  Sensitivity to perceived provocation: No  Easily angered when request denied: No  Total Score - DASA: 0      Withdrawl Symptoms:            Hygiene:     Level of Assistance: Independent      Nutrition Screen:     Feeding: Able to feed self  Diet Type: Oral diet as ordered  Appetite: Brigid Re, RN  08/16/2022  12:29 PM

## 2022-08-16 NOTE — Group Note (Signed)
Group Note    Patient Name: Tasha Buckley    Date: 08/16/2022      Time: 0900     Group Type: Goal Setting    Group Name: Community    Group Objective: This clinician assessed patients' physical and mental wellness via group discussion and review of daily check in document. Clinician also assessed patients' for presence of suicidal and/or homicidal ideation, plans or intentions and addressed patient concerns. Unit schedule, rules and regulation were reviewed. In addition, group discussed the importance of hygiene as needed.      Attendance: Did not attend    Interactions: Did not interact    Mood/Affect: Unable to assess    Patient Participation: Patient declined group due to sleeping. Staff will offer materials.    Dennie Fetters MHS

## 2022-08-16 NOTE — Group Note (Signed)
Group Note    Patient Name: Tasha Buckley    Date: 08/16/2022      Time: 1700     Group Type: CBT    Group Name: Cognitive Distortions/Thought Traps    Group Objective:  Identify the most common cognitive distortions that exacerbate or trigger negative emotions.     Attendance: Attended    Interactions: Interacted appropriately    Mood/Affect: Appropriate    Patient Participation:  Tasha Buckley attended the group and provided input consistent with the objective. The pt engaged in conversations about cognitive distortions and how to correct them.     Debbora Dus, MHS

## 2022-08-16 NOTE — Nursing Note (Signed)
Pt asleep at 2115. Pt slept 8.5 hours this shift. Q 15 minute checks maintained throughout shift per unit protocol.

## 2022-08-16 NOTE — Progress Notes (Signed)
[facility] of [facility]  Inpatient Progress Note    Name: Tasha Buckley  MRN: [id number]  Total Duration: 53 minutes  Psychotherapy Add-On Duration: n/a    Interval History:    Pt discussed with treatment team during interdisciplinary rounds. Per RN report- pt slept 8.5 hrs, had a fair appetite, was upset when husband did not visit as planned. No unsafe or inappropriate behaviors reported.    Tasha Buckley is observed napping in her room this morning, though relates that she had been up earlier. Reports that her husband is coming to visit tonight and we discuss anxiety associated with the conversation. She continues to deny SI and feels excited about her upcoming job. We continue to discuss tentative dc tomorrow.    Physical Review Of Systems:  Review of Systems   Cardiovascular:  Negative for chest pain.   Neurological:  Negative for dizziness and headaches.         Psychiatric Review Of Systems:  Sleep: fair  Interest: increased  Anhedonia: decreased  Appetite Changes: unchanged  Weight Changes: No change  Energy: unchanged  Anxiety/Panic:  varying  Guilt: decreased   Hopeless: decreased  S.I.B.s/risky behavior:  absent    Objective:  Vital signs in last 24 hours:  Temp:  [97.9 F (36.6 C)] 97.9 F (36.6 C)  Heart Rate:  [62] 62  Resp:  [18] 18  BP: (130)/(84) 130/84    Labs:  Available labs reviewed     Scheduled Meds:   ARIPiprazole  10 mg Oral Nightly (2100)    FLUoxetine  60 mg Oral Nightly (2100)     PRN Meds:.acetaminophen, hydrOXYzine pamoate, OLANZapine **AND** sterile water, olanzapine zydis, polyethylene glycol, traZODone    Mental Status Exam:  General   Development: Normal  Grooming/ Hygiene: Unkempt  Demeanor: Polite, Cooperative  Eye Contact: Appropriate  Speech  Rate: Normal  Volume: Normal  Articulation: Normal  Quality: Normal  Motor  Atrophy: None  Abnormal movements: None  Station: Normal  Gait: Normal  Mood/ Affect  Mood: Neutral  Range: Normal  Reactivity: Normal  Appropriateness: Appropriate to  mood and/ or situation  Thought   Content: Normal  Process: Normal  Associations: Normal  Physical and Psychological Reality Test: Normal, Hallucinations - denied  Cognitive  Level of Alertness: Normal  Orientation: Oriented to all spheres  Short term memory: Intact  As evidenced by: Recall of medication taken this morning  Long term memory: Intact  As evidenced by: other (verifiable treatment history)  Attention/ Concentration/ Focus: Intact  Language: Intact  Intellect: Average  As evidenced by: Vocabulary, Education  Fund of knowledge: Intact  Safety  Harm to self: No  Risk of Harm to self: Moderate  Harm to others: No  Risk of Harm to others: Low  Insight/ Judgement  Insight: Full  Judgment: Intact           Diagnoses:  Borderline Personality Disorder  MDD, recurrent, moderate  GAD     Problem Based Assessment and Management Plan:   [name] is a 27 y.o. female with a history of MDD, BPD and GAD. She presents s/p suicide attempt which occurred in the context of marital stressors. Suicide attempt appears most related to BPD, though she does reports some persistent moderate depression prior to attempt.      BPD  -Currently not in therapy, SW will aid in arranging OP follow-up. Recommend DBT focus  -Suspect immediate suicidality has resolved, but given ongoing conflict with husband and pt's impulsivity, will continue  hospitalization to manage risk. Suspect LOS 2-3 days     MDD/GAD  -Increase Prozac to 60 mg   -Continue Abilify 10 mg for depression augmentation     Medical  -H&P reviewed, consult IM as needed  -Labs reviewed, add Vit D  -Reviewed EKG     Safety  -Monitor for SI, SIB  -Continue staggered observational checks  -Privilege level: off unit with supervision. Pt has demonstrated the ability to be safe in the hospital. No dangerous or inappropriate behaviors have been observed since admission. Pt has been calm and compliant with care. Pt has agreed to stay safe in the hospital and to notify staff  if feeling unsafe.     Discharge pending patient safety, symptom improvement, and medication tolerability. Risks, benefits, side effects, and alternatives to the above plan were discussed with patient. The patient voiced understanding and agreement with the treatment plan.    Iwalani Templeton MARIE Kaelen Brennan, CNP  08/16/2022

## 2022-08-16 NOTE — Plan of Care (Addendum)
Problem: Risk for Suicidal Behavior   Description: Tasha Buckley is at Risk for Suicidal Behaviors AEB suicide attempt by overdose on home medications.    C-SSRS:  HIGH    Goal: Goal 1:STG/Objective  Description: Tasha Buckley will verbalize at least 2 copings skills to use as alternatives for dealing with stress and emotional problems by 08/17/2022.     Outcome: Progressing   Tasha Buckley presents as sad with an responsive affect.  She rates anxiety 10/10 and depression 10/10.  She denies SI, HI and AVH currently.  She was observed social in the milieu.  She attended and engaged in 2 unit programs.  She ate 90% of dinner.  She was medication compliant.  PRNs:  Vistaril @ 1913 She was very upset and crying right after her visit with her husband. She stated He wants a divorce.          Tasha Buckley is able to verbalize that they will remain safe at this time.  Based on the Treatment guidelines and the professional opinion of the RN and treatment team, they may have their own clothes, standard linens and all silverware with meals.  [name] Risk Level = low.    Daily Medication Education Questions:  Do you have questions about your medications? no  2.   Any questions about side effects and how to manage them? no  3.   Any questions on how you take your medications? no    [facility]   Inpatient Shift Assessment    Name: Tasha Buckley  MRN: [id number]  Admission date: 08/14/2022        Vitals:     Temp: 97.9 F (36.6 C)  Temp Source: Oral  Heart Rate: 62  Resp: 18  BP: 130/84  MAP (mmHg): 68  BP Location: Left upper arm  BP Method: Automatic  Patient Position: Sitting  SpO2: 97 %  O2 Device: None (Room air)  Height: 5' 5 (165.1 cm)  Weight: (!) 259 lb (117.5 kg)  Weight Source: Standing Scale  BMI (Calculated): 43.2      Pain/ Pain Reassessment:     Pain Score: 0 - No Pain         Intake:            Output:            POCT Glucose:            [name] Suicide Severity Rating Scale - Frequent  Screener:     Have you actually had any thoughts of killing yourself since the last time you were asked?: No  Have you done anything, started to do anything, or prepared to do anything to end your life?: No    Assigned Risk: Low Risk  Low Risk Interventions: 15 minute checks at irregular intervals;Encourage to attend groups to learn coping skills, stress management, symptome management;Encourage attending DBT/CBT groups;Family/Significant other engagement;Pharmacological Treatment        Mental Status Exam:     Apparent Age: Appears Actual Age  Hygiene/Grooming: Disheveled  General Attitude: Cooperative;Pleasant  Motor Activity: Freedom of movement  Eye Contact: Appropriate  Facial Expression: Anxious;Sad  Patient Behaviors/Mood: Appropriate for situation;Anxious;Demanding  Impulsivity: Normal  Speech Pattern: Within Defined Limits  Mood: Anxious;Depressed  Affect: Responsive  Affect congruent with mood: Yes  Content: Within Defined Limits  Delusions: Within Defined Limits  Perception: Appropriate  Hallucination: None  Thought content appropriate to situation: No  Danger to Others (WDL): Within Defined Limits  Orientation Level:  Oriented X4  Insight: Impaired  Judgement: Impaired  Appetite Change: Normal for patient        [place] Fall Risk     Age: Less than 50  Mental Status: Fully Alert/ Oriented at all times  Elimination: Independent with control of bowel/ bladder  Medication: Psychotropic medications ( including benzos and antidepressants)  Psych Diagnosis: Major depressive disorder  Ambulation/ Balance: Independent/ Steady gait/ Immobile  Nutrition: No apparent abnormalities with appetite  Sleep Disturbance: No sleep disturbance  History of Falls: No history of falls  Secondary Diagnosis: No medical problems  [place] Fall Risk Score: 79             Patient Checks:     Interventions: Call bell within reach, ID band on, Dayroom observation  Visual Checks: Standard Q15  Arm Bands On: ID  Patient Checked for  Contraband: Belongings checked, Body checked, Clothing checked        Safety:     Depression rating: 10  Anxiety rating: 10  Self Injurious Thoughts: Denies  Self Injurious Behaviors: None observed  Thoughts of Harming Others: Denies  Current Thoughts of Aggression: No  Elopement risk?: No        DASA     Irritablity: No  Verbal Threats: No  Impulsivity: No  Negative attitude: Yes  Unwillingness to follow direction: No  Sensitivity to perceived provocation: No  Easily angered when request denied: No  Total Score - DASA: 1      Withdrawl Symptoms:            Hygiene:     Level of Assistance: Independent      Nutrition Screen:     Feeding: Able to feed self  Diet Type: Oral diet as ordered        Adonis Ryther, RN  08/16/2022  10:19 PM

## 2022-08-16 NOTE — Group Note (Signed)
Name: Tasha Buckley    Date: 08/16/2022    Time: 2:00pm    Group Type: Activity Group    Group Name: Courtyard/Outdoors time    Group Objective: Patients are taken outdoors to the courtyard for behavioral activation, mindfulness exercises, walking the labyrinth, etc.     Attendance: Did not attend    Interaction: Did not interact    Mood/Affect: Unable to assess    Participation: Pt declined group because she had a visitor.        Randye Lobo, MSW, LISW-S

## 2022-08-16 NOTE — Group Note (Signed)
Group Note    Patient Name: Tasha Buckley    Date: 08/16/2022     Time: 1100     Group Type: Enrichment    Group Name: Healthy Relationship Boundaries    Group Objective:Patients will discuss what healthy boundaries are and how to set them. This session will demonstrate where we need to place relationships for self-care, and how to create safe emotional spaces for ourselves and where to place relationships within appropriate boundaries.     Attendance: Attended    Interactions: Interacted appropriately    Mood/Affect: Appropriate    Patient Participation: Patient listened attentively and participated in group discussion. Patient received and reviewed handout.     Durene Romans, Assistant Chaplain

## 2022-08-17 MED ORDER — FLUoxetine (PROZAC) 20 MG capsule
20 | ORAL_CAPSULE | Freq: Every evening | ORAL | 0 refills | Status: AC
Start: 2022-08-17 — End: ?

## 2022-08-17 MED ORDER — hydrOXYzine pamoate (VISTARIL) 25 MG capsule
25 | ORAL_CAPSULE | Freq: Two times a day (BID) | ORAL | 0 refills | Status: AC | PRN
Start: 2022-08-17 — End: ?

## 2022-08-17 MED ORDER — ARIPiprazole (ABILIFY) 10 MG tablet
10 | ORAL_TABLET | Freq: Every day | ORAL | 0 refills | Status: AC
Start: 2022-08-17 — End: ?

## 2022-08-17 MED FILL — FLUOXETINE 20 MG CAPSULE: 20 20 MG | ORAL | Qty: 3

## 2022-08-17 MED FILL — ARIPIPRAZOLE 10 MG TABLET: 10 10 MG | ORAL | Qty: 1

## 2022-08-17 NOTE — Plan of Care (Signed)
Problem: At Risk for Suicide  Description: Pt was admitted to [facility] for SI  Intervention: Intervention  Description: SW will input therapy and medication management appointments for patient in their AVS and review with them    Responsible staff: Lyn Hollingshead MSW, LSW/designee  Note: SW spoke with pt's mom to gather her input on discharging the pt today, specifically because of the aftermath of the pt's husband visiting her last night. Mom reported that she is comfortable with discharge today and that the pt will be staying with her parents. Mom reported that she does not think having the pt stay inpatient is a good idea right now because she does not want the pt to be processing everything she has going on by herself. Mom did not have any further questions at this time.

## 2022-08-17 NOTE — Nursing Note (Signed)
Pt asleep at 2030. Pt slept 9.25 hours this shift. Q 15 minute checks maintained throughout shift per unit protocol.

## 2022-08-17 NOTE — Group Note (Signed)
Group Note    Patient Name: Tasha Buckley    Date: 08/17/2022      Time: 0900     Group Type: Goal Setting    Group Name: Community    Group Objective: This clinician assessed patients' physical and mental wellness via group discussion and review of daily check in document. Clinician also assessed patients' for presence of suicidal and/or homicidal ideation, plans or intentions and addressed patient concerns. Unit schedule, rules and regulation were reviewed. In addition, group discussed the importance of hygiene as needed.      Attendance: Attended    Interactions: Interacted appropriately    Mood/Affect: Appropriate and Bright    Patient Participation: Patient attended group and completed worksheet and shared with peers. Patient engaged in conversation about group topics.     Amada Kingfisher MHS

## 2022-08-17 NOTE — Plan of Care (Signed)
Problem: Problem  Description: Patient needs coping skills for coping with depression.   Goal: Goal  Description: Patient will identify coping skills to help manage coping with depression.   Outcome: Not Progressing  Goal: Objective  Description: Patient will attend daily RT group to identify at least 1-3 coping skills to address coping with depression.   Outcome: Progressing  Note: POC reviewed. RT assessment completed. Attending some RT groups and began working toward her goal.

## 2022-08-17 NOTE — Nursing Note (Signed)
Tasha Buckley presents as anxious and depressed with a responsive affect.  She rates anxiety 7/10 and depression 6/10.  She denies SI, HI and AVH currently.  She was observed socializing with peers on the unit.  She attended and engaged in 3 unit programs.  She ate 40% of breakfast.  Tasha Buckley was medication compliant.  PRNs:  None.      Reviewed all discharge instructions- including importance of medication management. Encouraged to keep medication list updated as medications are added, changed or deleted. Encouraged to keep list on person at all times in case of an emergency & to give a copy of the updated medication list to next care provider.  Information given regarding discharge meds and prescriptions were given as indicated.  Patient satisfaction survey left with patient to complete.  Tasha Buckley verbalized understanding and was accompanied to front lobby by staff to be discharged in care of family at 70.  [name] Discharge Screener completed.  [name] Risk Level = Low.     Tasha Buckley., Registered Nurse / [facility]   Inpatient Shift Assessment    Name: Tasha Buckley  MRN: [id number]  Admission date: 08/14/2022        Vitals:     Temp: 97.7 F (36.5 C)  Temp Source: Oral  Heart Rate: 71  Resp: 17  BP: 140/90  MAP (mmHg): 68  BP Location: Right upper arm  BP Method: Automatic  Patient Position: Sitting  SpO2: 100 %  O2 Device: None (Room air)  Height: 5' 5 (165.1 cm)  Weight: (!) 259 lb (117.5 kg)  Weight Source: Standing Scale  BMI (Calculated): 43.2      Pain/ Pain Reassessment:     Pain Score: 0 - No Pain         Intake:            Output:            POCT Glucose:            [name] Suicide Severity Rating Scale - Frequent Screener:     Have you actually had any thoughts of killing yourself since the last time you were asked?: No  Have you done anything, started to do anything, or prepared to do anything to end your life?: No    Assigned Risk: Low Risk  Low Risk Interventions:  15 minute checks at irregular intervals;Reassess risk daily, upon status change and discharge;Pharmacological Treatment;Family/Significant other engagement;Encourage attending DBT/CBT groups;Encourage to attend groups to learn coping skills, stress management, symptome management        Mental Status Exam:     Apparent Age: Appears Actual Age  Hygiene/Grooming: Disheveled  General Attitude: Cooperative;Pleasant  Motor Activity: Freedom of movement  Eye Contact: Appropriate  Facial Expression: Anxious;Sad  Patient Behaviors/Mood: Appropriate for situation;Appropriate for age  Impulsivity: Normal  Speech Pattern: Within Defined Limits  Mood: Anxious;Depressed  Affect: Responsive  Affect congruent with mood: Yes  Content: Within Defined Limits  Delusions: Within Defined Limits  Perception: Appropriate  Hallucination: None  Thought content appropriate to situation: Yes  Danger to Others (WDL): Within Defined Limits  Thought process: Goals directed;Appropriate  Orientation Level: Oriented X4           Ed[place] Fall Risk     Age: Less than 50  Mental Status: Fully Alert/ Oriented at all times  Elimination: Independent with control of bowel/ bladder  Medication: Psychotropic medications ( including benzos and antidepressants)  Psych  Diagnosis: Major depressive disorder  Ambulation/ Balance: Independent/ Steady gait/ Immobile  Nutrition: No apparent abnormalities with appetite  Sleep Disturbance: No sleep disturbance  History of Falls: No history of falls  Secondary Diagnosis: No medical problems  [place] Fall Risk Score: 28             Patient Checks:     Interventions: Call bell within reach, Dayroom observation, ID band on  Visual Checks: Standard Q15  Arm Bands On: ID  Patient Checked for Contraband: Belongings checked, Body checked, Clothing checked        Safety:     Depression rating: 6  Anxiety rating: 7  Self Injurious Thoughts: Denies  Self Injurious Behaviors: None observed  Thoughts of Harming Others:  Denies  Current Thoughts of Aggression: No  Elopement risk?: No  Methods to Calm Down: Change of environment;Quiet time in room;Reading;1:1 Time        DASA     Irritablity: No  Verbal Threats: No  Impulsivity: No  Negative attitude: No  Unwillingness to follow direction: No  Sensitivity to perceived provocation: No  Easily angered when request denied: No  Total Score - DASA: 0      Withdrawl Symptoms:            Hygiene:     Level of Assistance: Independent      Nutrition Screen:     Feeding: Able to feed self  Diet Type: Oral diet as ordered  Appetite: Tasha Fleet, RN  08/17/2022  11:47 AM

## 2022-08-17 NOTE — Plan of Care (Signed)
Problem: At Risk for Suicide  Description: Pt was admitted to [facility] for SI  Intervention: Intervention  Description: SW will input therapy and medication management appointments for patient in their AVS and review with them    Responsible staff: Lyn Hollingshead MSW, LSW/designee  08/17/2022 1007 by Lyn Hollingshead, MSW, LSW  Note: Discharge Note:  Pt leaving today per [facility] with mom to transport home. The patient satisfaction survey was given to the pt to complete. Pt signed ROIs for outpatient providers. Pt has 30 days scripts for medications. Pt and SW reviewed discharge safety plan as well as pt's follow up appointments, which are listed below. Pt's mood was stable and affect was congruent, denied SI/HI, denied A/V hallucinations, appeared alert and oriented x4, appeared to have organized thoughts.  Discharge Information  Referred to: Outpatient  Smoking Cessation offered: N/A  DC Plan discussed w/ pt/pt representative?: Yes  Discharge Disposition: Relative's home  Transportation: Family  Financial Support: Employeed Full Time  Support Services: Family  Recreation/ Leisure Resources: Other (movies, going out places, [facility])  Discharge Legal Status: N/A     Follow-up Information       Lynne Leader. Go on 08/24/2022.    Why: Telehealth therapy appointment on Friday 7/26 at 12:00. Please update the paperwork that was emailed to you prior to your appointment.  Contact information:  [facility]             [employer]. Go on 09/11/2022.    Why: Telehealth med management appointment on Tuesday 8/13 at 11:00. Please update the paperwork that was emailed to you prior to your appointment.  Contact information:  [facility]                           08/17/2022 0915 by Lyn Hollingshead, MSW, LSW  Note: SW spoke with pt's mom to gather her input on discharging the pt today, specifically because of the aftermath of the pt's husband visiting her last night. Mom reported that she is comfortable with discharge today and that the pt will  be staying with her parents. Mom reported that she does not think having the pt stay inpatient is a good idea right now because she does not want the pt to be processing everything she has going on by herself. Mom did not have any further questions at this time.

## 2022-08-17 NOTE — Discharge Instructions (Signed)
Crisis Management Plan    Remove all firearms, weapons (of any kind) or any unneeded medicines that could be used.  Identify a support person/advocate and attend follow-up mental health appointments with this person.  Be direct and talk openly about suicidal thoughts.  Allow expression of feelings.  Block all inappropriate internet websites and social media.  Get help from agencies that specialize in crisis intervention.  Create a personalized safety plan.   The following list are suicide prevention resources available 24 hours a day.    [facility] [place] [facility]: (513) 536-4673     [place]:    Crisis Hotline:     281-CARE (2273)     Psychiatric Emergency Services:  513-584-8577     Mobile Crisis Team:    513-584-5098    [place]:   [place] Consultation [facility] Crisis 513-881-7180    [place]:    Emergency Crisis Hotline:   513-528-SAVE (7283)    [place]:   [place] Emergency Crisis Line:  859-331-3292    National:    National Hotline:    1-800-273-TALK(8255)     www.suicidepreventionlifeline.org    Local Crisis Hotline  877-695-6333  Crisis Text Line 741741  National #    988  [facility] 800-273-8255, select 1  [facility] (LGBTQ teens) 866-488-7386

## 2022-08-17 NOTE — Group Note (Signed)
Name: Tasha Buckley    Date: 08/17/2022    Time: 11:52 AM    Group Type: Recreation Therapy    Group Name:  Mandala      Group Objective: Purpose of this group was to allow for creative expression and to discuss the idea of patience.  Participants were provided with a mandala to color their own mandala. Relaxation music provided. Discussed the importance of patience and relaxing leisure techniques.  Discussion about favorite activities and music.      Attendance: Attended     Interaction: Interacted appropriately     Mood/Affect: Appropriate     Participation: Pt engaged in group activity.  Pt was able to use creativity to color mandala.  Pt social with staff and peers.       Jru Pense E Cornie Herrington, CTRS

## 2022-08-17 NOTE — Discharge Summary (Signed)
[facility]  Discharge Summary      Patient Name: Tasha Buckley  MRN: [id number]  Duration: 35 minutes    Admission date:  08/14/2022    Discharge:   Date: 08/17/2022  Location: Home     Admission Diagnoses:  Borderline Personality Disorder  MDD, recurrent, moderate  GAD    Reason for Admission:   Patient is a 27 y.o. female who presents following an impulsive overdose attempt.  Patient was admitted  involuntarily.     [name] reports a history of MDD, GAD and BPD. She shares that her biggest stressor which destabilized her mood occurred on Friday. She and her husband decide to try something new sexually, which she ended up regretting immediately. She did not tell him, and he is upset with her that she did not tell him to stop and is now discussing divorce. Prior to this, does feel she was already struggling with depression and was self-medicating with THC gummies daily. She regretted her OD attempt immediately and went to the ER. She denies SI today and appears future oriented, mentioning excitement to start a new job next week. Reports stable sleep and appetite. Denies any history of mania or psychosis, does report periods of impulsivity lasting a few hours at a time.    Hospital Course Including Rationale for Medication Changes Made, Medically Focused Treatment Recommendations, Patient Response to the Treatment Provided, Transfer to Outside Hospitals, and Seclusion/Restraint:      The patient was admitted involuntarily  and subsequently signed in as a voluntary patient.  The patient was seen and evaluated by a multidisciplinary treatment team, in this evaluation patient was able to contribute freely. The patient was able to provide informed consent and outline the risks and  benefits of medications.  The patient's participation in unit programming was appropriate.  Overall, the patient made good use of the available therapeutic opportunities.  Improvements were seen in the patient's complaints of  anxiety and feeling depressed.    During admission, home medications were restarted, as pt had stopped taking several days ago. Adherence was focused on. Prozac was increased to 60 mg, as despite suicide attempt appeared impulsive and related to situational stressors, had reported moderate depression beforehand. Vistaril was also prescribed and the pt found it helpful for anxiety PRN.    At the time of discharge the patient was compliant with all prescribed medications. She denied having side effects to medications. She was calm and cooperative with care. No dangerous or inappropriate behaviors were observed on the unit prior to discharge. She was eating and sleeping well. Pt was advised to abstain from illicit drugs and alcohol. 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 She plans to stay with her parents before transitioning back to her apartment. She requested discharge from the hospital. Mother was contacted for collateral and supported d/c plan. She was not an imminent risk for dangerousness to herself, not grossly impaired, was not an imminent risk to others, and therefore did not meet criteria for involuntary hospitalization.     The patient is currently clinically sober, psychiatrically stable, has a cogent follow-up plan, and has a safety plan in place. Therefore, the patient does not present as an imminent risk to herself or others and is safe for discharge to the community.    Seclusion/Restraint: N/A    Complications: N/A    Consults:  N/A    Procedures:None  Discharge Medication List as of 08/17/2022 10:18 AM        START taking these medications    Details   hydrOXYzine pamoate (VISTARIL) 25 MG capsule Take 1 capsule (25 mg total) by mouth 2 times a day as needed for Anxiety. Indications: anxiety, Starting Fri 08/17/2022, Normal, Disp-60 capsule, R-0           CONTINUE these  medications which have CHANGED    Details   ARIPiprazole (ABILIFY) 10 MG tablet Take 1 tablet (10 mg total) by mouth daily. Indications: MOOD, Starting Fri 08/17/2022, Normal, Disp-30 tablet, R-0      FLUoxetine (PROZAC) 20 MG capsule Take 3 capsules (60 mg total) by mouth at bedtime. Indications: MOOD, Starting Fri 08/17/2022, Normal, Disp-90 capsule, R-0             Is the patient prescribed multiple antipsychotics? No    Was an FDA-approved tobacco cessation medication prescribed at discharge? No  The patient does not use tobacco products.    No Known Allergies     Pertinent Physical Findings: N/A  Pertinent Lab/Test Findings:  Low Vit D, 5000 units daily OTC recommended    I met with the patient to review symptoms, progress, relapse prevention, safety plan, and discharge planning. I prepared discharge paperwork, including prescriptions.     Mental Status Exam:  General   Development: Normal  Grooming/ Hygiene: Appropriately dressed, Clean  Demeanor: Polite, Cooperative  Eye Contact: Appropriate  Speech  Rate: Normal  Volume: Normal  Articulation: Normal  Quality: Normal  Motor  Atrophy: None  Abnormal movements: None  Station: Normal  Gait: Normal  Mood/ Affect  Mood: Euthymic  Range: Normal  Reactivity: Normal  Appropriateness: Appropriate to mood and/ or situation  Thought   Content: Normal  Process: Normal  Associations: Normal  Physical and Psychological Reality Test: Normal, Hallucinations - denied  Cognitive  Level of Alertness: Normal  Orientation: Oriented to all spheres  Short term memory: Intact  As evidenced by: Recall of medication taken this morning  Long term memory: Intact  As evidenced by: other (verifiable treatment history)  Attention/ Concentration/ Focus: Intact  Language: Intact  Intellect: Average  As evidenced by: Vocabulary, Education  Fund of knowledge: Intact  Safety  Harm to self: No  Risk of Harm to self: Low (acutely; chronic risk is moderate)  Harm to others: No  Risk of Harm to  others: Low  Insight/ Judgement  Insight: Full  Judgment: Intact      Suicide Risk and Protective Factors at Time of Discharge:  Suicide Risk Factors:single/divorced, depressed mood, prior suicide attempt, and history of violence/anger/recklessness  Suicide Protective Factors: female gender, religious/moral objections to suicide, denies suicidal ideation, does not have lethal plan, does not have access to guns or weapons, patient is contracting for safety, employed, patient has social or family support, no active psychosis or cognitive dysfunction, physically healthy, has outpatient services in place/rendered, compliant with recommended medications, collateral information from mother confirms patient safety, and and patient is future oriented  Safety plan discussed with patient and support team.    Discharge Diagnoses:  Borderline Personality Disorder  MDD, recurrent, moderate  GAD    Medical Condition on Discharge: Stable  Psychiatric Condition on Discharge: Stable, Not imminently dangerous self/others, Able to maintain ADL in planned disposition, and Adequately in touch with reality  Functional Condition on Discharge:  Independent    Discharge Disposition: Relative's home  Financial Support: Employeed Full Time  Support Services: Family  Recreation/ Leisure Resources: Other (movies, going out places, [place])       Follow-up Information       Lynne Leader. Go on 08/24/2022.    Why: Telehealth therapy appointment on Friday 7/26 at 12:00. Please update the paperwork that was emailed to you prior to your appointment.  Contact information:  [facility]             [employer]. Go on 09/11/2022.    Why: Telehealth med management appointment on Tuesday 8/13 at 11:00. Please update the paperwork that was emailed to you prior to your appointment.  Contact information:  [facility]                           MD CAREPLAN:  Multi-Disciplinary Problems (from MD Treatment Plan)      Active Problems       Not on file               Resolved Problems       Problem: Problem #1  Start Date: 08/15/22, Resolve Date: 08/17/22      Problem Details: Aubrina Bolster has depression AEB low interest/motivation, suicidal ideations, depressed mood.        Goal Priority Disciplines Start Date Expected End Date End Date    Goal 1:Long Term Goal  (RESOLVED) -- Interdisciplinary, PHYSICIAN 08/15/22 08/22/22 08/17/22    Goal Details: Kennidee Simonton will demonstrate manageability of mood and ability to adequately attend to ADLs by discharge from [facility].          Goal Priority Disciplines Start Date Expected End Date End Date    Goal 1:STG/Objective  (RESOLVED) -- Interdisciplinary, PHYSICIAN 08/15/22 08/18/22 08/17/22    Goal Details: Shante Blough will show a reduction in suicide potential by 08/18/2022.          Goal Intervention Frequency Disciplines Start Date End Date    Intervention A Daily Interdisciplinary, PHYSICIAN 08/15/22 08/17/22    Intervention Details: Reganne Gering will show a reduction in suicide potential by 08/18/2022.          Goal Intervention Frequency Disciplines Start Date End Date    Intervention B Daily Interdisciplinary, PHYSICIAN 08/15/22 08/17/22    Intervention Details: Provider will meet with [name] daily to evaluate treatment progress and provide psychotherapy as needed                      Responsible Staff: Alinda Dooms, APRN/Designee                                For detailed description of aftercare recommendations, please refer to the After Visit Summary.    Lavonne Cass MARIE Roverto Bodmer, CNP

## 2022-12-30 ENCOUNTER — Emergency Department: Admit: 2022-12-30 | Payer: PRIVATE HEALTH INSURANCE

## 2022-12-30 ENCOUNTER — Inpatient Hospital Stay
Admit: 2022-12-30 | Discharge: 2022-12-31 | Disposition: A | Discharge status: Other Acute Inpatient Hospital | Payer: PRIVATE HEALTH INSURANCE

## 2022-12-30 DIAGNOSIS — T43222A Poisoning by selective serotonin reuptake inhibitors, intentional self-harm, initial encounter: Secondary | ICD-10-CM

## 2022-12-30 DIAGNOSIS — T50902A Poisoning by unspecified drugs, medicaments and biological substances, intentional self-harm, initial encounter: Secondary | ICD-10-CM

## 2022-12-30 LAB — ETHANOL, SERUM: Ethanol: <10 mg/dL (ref 0–10)

## 2022-12-30 LAB — BASIC METABOLIC PANEL
Anion Gap: 9 mmol/L (ref 3–16)
BUN: 10 mg/dL (ref 7–25)
CO2: 26 mmol/L (ref 21–33)
Calcium: 9.8 mg/dL (ref 8.6–10.3)
Chloride: 104 mmol/L (ref 98–110)
Creatinine: 0.79 mg/dL (ref 0.60–1.30)
EGFR: >90
Glucose: 102 mg/dL — ABNORMAL HIGH (ref 70–100)
Osmolality, Calculated: 287 mosm/kg (ref 278–305)
Potassium: 3.8 mmol/L (ref 3.5–5.3)
Sodium: 139 mmol/L (ref 133–146)

## 2022-12-30 LAB — HIGH SENSITIVITY TROPONIN: High Sensitivity Troponin: <3 ng/L (ref 0–14)

## 2022-12-30 LAB — CBC
Hematocrit: 47.9 % — ABNORMAL HIGH (ref 35.0–45.0)
Hemoglobin: 16.3 g/dL — ABNORMAL HIGH (ref 11.7–15.5)
MCH: 30 pg (ref 27.0–33.0)
MCHC: 34 g/dL (ref 32.0–36.0)
MCV: 88.3 fL (ref 80.0–100.0)
MPV: 7.6 fL (ref 7.5–11.5)
Platelets: 428 10*3/uL — ABNORMAL HIGH (ref 140–400)
RBC: 5.42 10*6/uL — ABNORMAL HIGH (ref 3.80–5.10)
RDW: 12.9 % (ref 11.0–15.0)
WBC: 15.2 10*3/uL — ABNORMAL HIGH (ref 3.8–10.8)

## 2022-12-30 LAB — DIFFERENTIAL
Basophils Absolute: 91 /uL (ref 0–200)
Basophils Relative: 0.6 % (ref 0.0–1.0)
Eosinophils Absolute: 198 /uL (ref 15–500)
Eosinophils Relative: 1.3 % (ref 0.0–8.0)
Lymphocytes Absolute: 4165 /uL — ABNORMAL HIGH (ref 850–3900)
Lymphocytes Relative: 27.4 % (ref 15.0–45.0)
Monocytes Absolute: 836 /uL (ref 200–950)
Monocytes Relative: 5.5 % (ref 0.0–12.0)
Neutrophils Absolute: 9910 /uL — ABNORMAL HIGH (ref 1500–7800)
Neutrophils Relative: 65.2 % (ref 40.0–80.0)
nRBC: 0 /100{WBCs} (ref 0–0)

## 2022-12-30 LAB — HEPATIC FUNCTION PANEL
ALT: 16 U/L (ref 7–52)
AST: 16 U/L (ref 13–39)
Albumin: 4.7 g/dL (ref 3.5–5.7)
Alkaline Phosphatase: 67 U/L (ref 36–125)
Bilirubin, Direct: 0.1 mg/dL (ref 0.0–0.4)
Bilirubin, Indirect: 0.7 mg/dL (ref 0.0–1.1)
Total Bilirubin: 0.8 mg/dL (ref 0.0–1.5)
Total Protein: 8.3 g/dL (ref 6.4–8.9)

## 2022-12-30 LAB — PROTIME-INR
INR: 0.9 (ref 0.9–1.1)
Protime: 13.1 s (ref 12.1–15.1)

## 2022-12-30 LAB — POC GLU MONITORING DEVICE: POC Glucose Monitoring Device: 95 mg/dL (ref 70–100)

## 2022-12-30 LAB — SALICYLATE LEVEL: Salicylate Lvl: <3 mg/dL — ABNORMAL LOW (ref 10–30)

## 2022-12-30 LAB — APTT: aPTT: 33 s (ref 25.5–35.0)

## 2022-12-30 LAB — HCG, SERUM, QUALITATIVE: Preg, Serum: NEGATIVE

## 2022-12-30 LAB — ACETAMINOPHEN LEVEL: Acetaminophen Level: <10 ug/mL — ABNORMAL LOW (ref 10–30)

## 2022-12-30 MED ORDER — ondansetron (ZOFRAN) injection 4 mg
4 | Freq: Once | INTRAMUSCULAR | Status: AC
Start: 2022-12-30 — End: 2022-12-30
  Administered 2022-12-30: 22:00:00 via INTRAVENOUS

## 2022-12-30 MED FILL — ONDANSETRON HCL (PF) 4 MG/2 ML INJECTION SOLUTION: 4 4 mg/2 mL | INTRAMUSCULAR | Qty: 2

## 2022-12-30 NOTE — ED Provider Notes (Signed)
[facility] ED Reassessment Note    Tasha Buckley is a 27 y.o. female patient who presented to the Emergency Department. This patient was initially seen by an off-going provider. Please see that provider's note for details regarding the initial history, physical exam, and ED course.    Care of this patient was signed out to me. I reviewed the initial history, physical exam, and diagnostic workup performed by the off-going provider. The patient was evaluated by myself and the ED Attending Physician, Dr. Modena Nunnery, MD. All management and disposition plans were discussed and agreed upon.     ED Course and MDM     Tasha Buckley is a 27 y.o. female with PMHx of MDD who presented to the emergency department with suicide attempt (ingested Prozac 40 mg x10-15 mg at 1600). [facility] already consulted. Please refer to the off-going provider's note for their initial history, physical exam, diagnostic workup, and ED course.    ED Course as of 12/31/22 0106   Sun Dec 30, 2022   1907 Received sign-out from off-going provider. At this time, the following is pending:  - Follow-up pending UA, UDS, and repeat ECG  - Observe until 2200 per [facility] recs  - Re-engage SW regarding disposition  - Reassessment and disposition   1944 On reassessment, patient denies any other complaints at this time.  She confirms that she ingested only Prozac as noted in the off going providers note at approximately 4 PM.  She remained hemodynamically stable.  Exam without any signs or symptoms consistent with serotonin syndrome without any clonus noted on exam.  I discussed the patient's case with social work who had contacted multiple facilities though there are no beds available.  As such, she will likely be observed in the emergency department overnight and social work will reassess bed availability in the morning.  Patient's only medications include Prozac and Abilify though will defer administering Abilify at this time given her overdose on Prozac.   Will continue to monitor   1955 Urinalysis w/Rfl to Microscopic(!):    Color, UA Straw   Clarity, UA Clear   Specific Gravity, UA 1.022   pH, UA 6.5   Protein, UA 70(!)   Glucose, UA Negative   Ketones, UA Negative   Bilirubin, UA Negative   Blood, UA Negative   Nitrite, UA Negative   Urobilinogen, UA <2.0   Leukocyte Esterase, UA Moderate(!)   RBC, UA <1   WBC, UA 5   Squam Epithel, UA 3   Granular Casts, UA 8(!)  No evidence of UTI   2017 Urine Drug Screen w/o Confirmation,Stat(!):    Amphetamine, 500 ng/mL Cutoff Negative   Barbiturates UR, 300  ng/mL Cutoff Negative   Buprenorphine, 5 ng/mL Cutoff Negative   Benzodiazepines UR, 300 ng/mL Cutoff Negative   Cocaine UR, 300 ng/mL Cutoff Negative   Methadone, UR, 300 ng/mL Cutoff Negative   Opiates UR, 300 ng/mL Cutoff Negative   Oxycodone, 100 ng/mL Cutoff Negative   Tricyclic Antidepressants, 300 ng/mL Cutoff Negative   THC UR, 50 ng/mL Cutoff Presumptive Positive(!)   Fentanyl, 2 ng/mL Cutoff Negative  Positive only for [facility]   2124 ECG for indication of overdose  Repeat ECG performed without any evidence of STEMI or other findings suggestive of acute ischemia.  All intervals within normal limits with no significant changes compared to most recent prior ECG obtained during ED course   2217 Patient has been monitored here in the emergency department until 10 PM per [facility]  recommendations and is currently medically clear.  However, social work was unable to find any psychiatric facility for transfer so patient will board in the emergency department overnight.  As noted, her only other medication aside from Prozac was Abilify though will defer giving her her home Abilify given the Prozac overdose.  Patient denies any new complaints at this time and continues to remain hemodynamically stable   Mon Dec 31, 2022   0104 Patient accepted for transfer to [facility].  EMTALA paperwork completed     ECG #2:  Indication: Overdose,  Rate: 57,  Rhythm: Sinus  bradycardia,  Ectopy: None,  Intervals and Conduction: Normal,  Axis: Normal,  ST Segment Change: No ST elevations or depressions,  T waves:  Isolated abnormal T wave inversion in lead III ,  Q waves: No pathologic Q waves,  Comparison to Prior ECG: No significant changes compared to ECG obtained earlier in her ED course,  Interpretation: Nonspecific T wave abnormality, no acute changes compared to most recent prior ECG obtained during ED course.    Medical Decision Making  Problems Addressed:  Intentional overdose, initial encounter (CMS-HCC): complicated acute illness or injury  Suicide attempt (CMS-HCC): complicated acute illness or injury    Amount and/or Complexity of Data Reviewed  Independent Historian: EMS  External Data Reviewed: notes.  Labs: ordered. Decision-making details documented in ED Course.  Radiology: ordered. Decision-making details documented in ED Course.  ECG/medicine tests: ordered. Decision-making details documented in ED Course.    Risk  Prescription drug management.    Summary of Treatment in ED:  Medications   ondansetron (ZOFRAN) injection 4 mg (4 mg Intravenous Given 12/30/22 1652)     Impression     1. Intentional overdose, initial encounter (CMS-HCC)    2. Suicide attempt (CMS-HCC)       Disposition     At this time the patient has been transferred to [facility] for further evaluation and management of suicidality with suicide attempt. EMTALA documentation completed. The patient will continue to be monitored here in the emergency department until which time they are transferred to the accepting hospital.    Follow-Up/Future Appointments:  No follow-up provider specified.  No future appointments.  Discharge Medications:  New Prescriptions    No medications on file       Clydie Braun, MD  [facility] Emergency Medicine, PGY-4    This note was dictated using voice-recognition software, which occasionally leads to inadvertent typographic errors.

## 2022-12-30 NOTE — ED Triage Notes (Signed)
Patient and mother arrives alert and oriented advised she took 10-15 Fluoxetine approximately 45 minutes ago in attempt to kill herself.  Hx of SI, last one in July.  C/O abdominal pain and chest pain.  Patient taken back to RM 5.

## 2022-12-30 NOTE — ED Notes (Signed)
 Bed: A05W  Expected date:   Expected time:   Means of arrival:   Comments:  si

## 2022-12-30 NOTE — ED Notes (Signed)
 Sitter at bedside. See close observation record.

## 2022-12-30 NOTE — ED Triage Notes (Addendum)
Pt states she took between 10-15 tablets of 40mg  prozac approximately 45 mins ago in the attempt to kill herself because I hate life.  Pt currently vomiting.  Pt has taken pills in the past.  Pt states she was having chest pain when she came in, but not having chest pain since vomiting.  Pt denies dizziness, ha, and confusion.  Pt states she has been off her depression medication.

## 2022-12-30 NOTE — Progress Notes (Signed)
[facility]  Psychiatric Social Worker Assessment Consult Note      Tasha Buckley      [id number]     Chief complaint in patient's own words:: Patient presents to [facility] via squad for intentional overdose. Patient reported she took between 10-15 Prozac 40mg  tablets.    Brief description of presenting problem: Per MD: Tasha Buckley is a 27 y.o. female with history of depression who presents with suicidal attempt. Patient took about 10 to 15 pills of 40 mg of Prozac about 4 PM. Patient reports that she initially had chest pain and abdominal pain with associated nausea, but she thinks that may be because she had not had any food all day. Patient did throw appear, and after that, patient reports that her vomiting felt better. This pain had resolved. Denies any shortness of breath. No dizziness, lightheadedness. Feels a little bit jittery. Denies any other coingestions.     Other than stated above, no additional associated symptoms or aggravating or alleviating factors are noted.      Summary of Presenting Circumstances: Patient is a/an 27 y.o. female with a hx of BPD, GAD, MDD who presents to [facility] via squad.    Patient is on statement of belief/statement of observation.  Statement of Belief is written by Other stating Patient attempted to end her life by intentional overdose.Marland Kitchen           Psychiatric History: Patient reports she has been inpatient to a couple different units but she can't remember the names.   Hx shows 08/14/22 [facility] for intentional OD and 08/29/16 [facility] for SI. Patient reported she was seeing a therapist and psychiatrist through [facility] but missed the last several appointments.        Chemical Dependency History: Patient reported she uses editable marijuana gummies several times a day and she drinks socially.     Social History, Support System and Current Living Situation: Patient lives alone. Family appears to be supportive.    Collateral Information: none      Mental Status Exam:    Appearance  Apparent Age: Appears Actual Age  Grooming: Unremarkable  Dress/Attire: Unremarkable  Health/body habitus/hygiene: Unremakable  Eye Contact: Appropriate    Behavior  Behavior: Within normal limits  Psychomotor activity: within normal limits  Speech/Language: within normal limits    Mood  Congruent with affect: Yes    Affect  Affect Quality : Cooperative  Affect Range: Normal/Full  Appropriateness (click one) : Appropriate to circumstance    Thought  Thought processes: organized/goal directed/linear  Thought Content: Within normal limits  Perception: Appropriate    Cognition  Alertness/Sensorium: Alert  Orientation: Person, Place, Date/time, Situation  Memory: Intact  Attention/Concentration: Intact/Normal  Intelligence: Average  Insight: Full insight  Judgement: Fair       Risk Factors/Stress Factors:    Stress/Risk  Patient-Identified Stress Factors: Other (Alot of stuff going on)  Provider-Identified Psychological Stressors/Experiences: Other (patient not specific)  Assault/Risk Assessment : None  Current Episode of Self harm/Suicide Attempt/SI plan : 12/30/22: Patient took 10-15 40mg  prozac tabs as intentional overdose  Previous Episode of Self Harm/Suicide Attempt/SI plan: 08/14/22: Patient took 6 800mg  ibuprofen tabs as intentional overdose  Family Suicide Hx: No  Current Plans of Homicide or to Harm Another : none  Previous Plans of Homicide or to Harm Another : none  Access to firearms: No  Other Concern for Lethal Means/Identified Plan to Harm Self: Medicine  Biopsychosocial Risk Factors to Suicide: Anxiety disorder, Personality disorder, Impulsivity  Environmental Risk Factors to Suicide: other (Patient stopped seeing her therapist and provider through [facility])  Cultural Risk Factors to Suicide: none  Protective Factors: support system, cultural or religious beliefs, pattern of help seeking behavior      Mandated Reporter Action:  No       Recommendation for Disposition/ Follow Up: Patient  is a/an 27 y.o. female with a hx of BPD, GAD, MDD seen for evaluation by psychiatric social worker in [facility].  Patient assessed by Child psychotherapist with the following treatment recommendation: Transfer to [facility].  Patient meets criteria for inpatient psychiatric admission, and will be transferred to [facility].     Patient notified of plan: yes  Patient reaction to plan: patient ok with plan.  SOB signed: yes SW will confirm original in transfer packet, copy is on chart.  [facility] notified of transportation safety needs: yes  Accepting [facility]: TBD. Patient's disposition recommendation is [facility] .          Wynetta Fines, LISW  [phone]

## 2022-12-30 NOTE — ED Provider Notes (Signed)
[facility] ED Note    Date of Service: 12/30/2022  Reason for Visit: Overdose-Intentional and Suicidal      HPI     Tasha Buckley is a 27 y.o. female with history of depression who presents with suicidal attempt. Patient took about 10 to 15 pills of 40 mg of Prozac about 4 PM. Patient reports that she initially had chest pain and abdominal pain with associated nausea, but she thinks that may be because she had not had any food all day. Patient did throw appear, and after that, patient reports that her vomiting felt better. This pain had resolved. Denies any shortness of breath. No dizziness, lightheadedness. Feels a little bit jittery. Denies any other coingestions.    Other than stated above, no additional associated symptoms or aggravating or alleviating factors are noted.    Past Medical History     Past Medical History:   Diagnosis Date    Anxiety     Concussion     mild, age [age], fell on treadmill at gym    Depression     Mood swings        No past surgical history on file.    Patient  reports that she has never smoked. She has never used smokeless tobacco. She reports current alcohol use of about 10.0 standard drinks of alcohol per week. She reports current drug use. Frequency: 4.00 times per week. Drug: Marijuana.    Previous Medications    ARIPIPRAZOLE (ABILIFY) 10 MG TABLET    Take 1 tablet (10 mg total) by mouth daily. Indications: MOOD    FLUOXETINE (PROZAC) 20 MG CAPSULE    Take 3 capsules (60 mg total) by mouth at bedtime. Indications: MOOD    HYDROXYZINE PAMOATE (VISTARIL) 25 MG CAPSULE    Take 1 capsule (25 mg total) by mouth 2 times a day as needed for Anxiety. Indications: anxiety       Allergies as of 12/30/2022    (No Known Drug Allergies or Adverse Reactions)       All nursing notes and triage notes were appropriately reviewed in the course of the creation of this note.     Review of Systems     ROS as stated above, all other systems  reviewed and are negative.    Physical Exam     There were no vitals filed for this visit.    Physical Exam  Constitutional:       Appearance: Normal appearance.   HENT:      Head: Normocephalic and atraumatic.      Nose: Nose normal.      Mouth/Throat:      Pharynx: Oropharynx is clear.   Eyes:      Pupils: Pupils are equal, round, and reactive to light.   Cardiovascular:      Rate and Rhythm: Normal rate and regular rhythm.      Pulses: Normal pulses.      Heart sounds: Normal heart sounds.   Pulmonary:      Effort: Pulmonary effort is normal.      Breath sounds: Normal breath sounds.   Abdominal:      Palpations: Abdomen is soft.      Tenderness: There is no abdominal tenderness.   Musculoskeletal:         General: Normal range of motion.      Cervical back: Neck supple.   Skin:     General: Skin is warm.      Capillary Refill:  Capillary refill takes less than 2 seconds.   Neurological:      General: No focal deficit present.      Mental Status: She is alert.         Diagnostic Studies     Labs:  Labs Reviewed - No data to display    Radiology:  No orders to display       EKG:    Rhythm: normal sinus   Rate: normal  Axis: normal  Ectopy: none  Conduction: normal  ST Segments: no acute change  T Waves: no acute change  Q Waves: none  Clinical Impression: no acute changes    Procedures     Procedures    ED Course and MDM     Ameisha Bielefeldt is a 27 y.o. female with a history and presentation as described below in HPI.  The patient was evaluated by myself and the ED Attending Physician. All management and disposition plans were discussed and agreed upon.    ED Course as of 12/30/22 Rometta Emery Dec 30, 2022   1651 Patient is a 27 y.o. female who presents for Prozac ingestion that was intentional. Upon presentation, patient was afebrile, not tachycardic, satting well on RA. On examination, patient was in no acute distress, well appearing, breathing spontaneously. On exam, she does not have any hyperreflexia, pupils  were equal and reactive bilaterally, did not have any psychomotor agitation, no rigidity. [facility] was consulted who recommend 6-hour observation after time of ingestion which will be 10 PM. Patient was initially nauseous, but after her vomiting, reported that she felt better. Patient was given Zofran. Cussed risk versus benefits of charcoal with patient given that she does have some nausea, though we did give her Zofran and she will try. Initial EKG showed normal sinus rhythm, no prolonged QT, QTc was 461.     1834 Labs remarkable for a BMP without electrolyte abnormalities, no AKI. CBC with leukocytosis to 15.2, likely reactive, no anemia. Negative salicylate, acetaminophen, ethanol. Negative pregnancy test. Troponin less than 3. LFTs reassuring. Chest x-ray is normal.   1835 Patient will be observed until 10 PM.       Medical Decision Making  Amount and/or Complexity of Data Reviewed  Labs: ordered.  Radiology: ordered.  ECG/medicine tests: ordered.    Risk  Prescription drug management.         Medications received during this ED visit:  Medications - No data to display    Sign Out - At this time, I am going off-service and will be signing out care of this patient to my colleague Dr. Rod Can for further care. My colleague's responsibilities will include:    Impression     No diagnosis found.     Plan     Sign Out  1. The patient is to be signed out in stable condition  2. Workup, treatment and diagnosis were discussed with the patient and/or family members; the patient agrees to the plan and all questions were addressed and answered.  ______________________________________________________________________________  Duanne Moron, MD   PGY-3  [facility]     This note was dictated using voice-recognition software, which occasionally leads to inadvertent typographic errors.       Duanne Moron, MD  Resident  12/30/22 [phone]

## 2022-12-30 NOTE — ED Provider Notes (Signed)
[facility] ED  Reassessment Note    Tasha Buckley is a 27 y.o. female who presented to the emergency department on 12/30/2022. This patient was initially seen by an off-going provider and their care has been turned over to me. Please see the original provider's note for details regarding the initial history, physical exam and ED course.  At the time of turnover the following steps in the patient's evaluation were pending: placement to psych facility    Patient presenting after intentional overdose of fluoxetine.  Medically clear per previous team.  No psych bed availability at this time. Will continue to monitor while awaiting placement.    Patient accepted by [facility]  Will transfer.  They will set up transportation.    Clinical Impression:    1.  Intentional overdose

## 2022-12-30 NOTE — ED Notes (Signed)
Urine specimen collected at this time and sent to lab.

## 2022-12-30 NOTE — ED Notes (Signed)
Parents at bed side. Pt calm and collected. See close observation record. Sitter outside door. Door is open.

## 2022-12-30 NOTE — ED Provider Notes (Signed)
ED Attending Attestation Note    Date of service:  12/30/2022    This patient was seen by the resident physician.  I have seen and examined the patient, agree with the workup, evaluation, management and diagnosis. The care plan has been discussed and I concur.  I have reviewed the ECG and concur with the resident's interpretation.    My assessment reveals a 27 y.o. female with a history of MDD, GAD, BPD who overdosed on her fluoxetine.  The patient states that she took about 12 to 13 pills of her fluoxetine today.  She had been off her psychiatric medications for the past month.  She denies any particular trigger to states that life has been hard.  She currently is denying any pain or symptoms.  Patient denies any nausea or vomiting but was dry heaving when she arrived.  On assessment she is alert oriented and cooperative and compliant.  She is noted to be hemodynamically stable.  She is a soft abdomen. The patient was placed on a statement of belief here upon arrival given the ingestion.    Of note the patient was recently discharged from [facility] after being there for 3 days with an involuntary hold.  She had had an impulsive overdose attempt at that time.

## 2022-12-30 NOTE — ED Notes (Signed)
Pt resting comfortably, breathing easily.

## 2022-12-31 LAB — URINALYSIS W/RFL TO MICROSCOPIC
Bilirubin, UA: NEGATIVE
Blood, UA: NEGATIVE
Glucose, UA: NEGATIVE mg/dL
Granular Casts, UA: 8 /LPF — ABNORMAL HIGH (ref 0–0)
Ketones, UA: NEGATIVE mg/dL
Nitrite, UA: NEGATIVE
Protein, UA: 70 mg/dL — AB
RBC, UA: <1 /HPF (ref 0–3)
Specific Gravity, UA: 1.022 (ref 1.005–1.035)
Squam Epithel, UA: 3 /HPF (ref 0–5)
Urobilinogen, UA: <2 mg/dL (ref 0.2–1.9)
WBC, UA: 5 /HPF (ref 0–5)
pH, UA: 6.5 (ref 5.0–8.0)

## 2022-12-31 LAB — URINE DRUG SCREEN WITHOUT CONFIRMATION, STAT
Amphetamine, 500 ng/mL Cutoff: NEGATIVE
Barbiturates UR, 300  ng/mL Cutoff: NEGATIVE
Benzodiazepines UR, 300 ng/mL Cutoff: NEGATIVE
Buprenorphine, 5 ng/mL Cutoff: NEGATIVE
Cocaine UR, 300 ng/mL Cutoff: NEGATIVE
Fentanyl, 2 ng/mL Cutoff: NEGATIVE
Methadone, UR, 300 ng/mL Cutoff: NEGATIVE
Opiates UR, 300 ng/mL Cutoff: NEGATIVE
Oxycodone, 100 ng/mL Cutoff: NEGATIVE
THC UR, 50 ng/mL Cutoff: POSITIVE — AB
Tricyclic Antidepressants, 300 ng/mL Cutoff: NEGATIVE

## 2022-12-31 NOTE — ED Notes (Signed)
 Sitter at bedside. See close observation record.
